# Patient Record
Sex: Female | Born: 1965 | Race: White | Hispanic: No | Smoking: Never smoker
Health system: Southern US, Community
[De-identification: ages and names within clinical notes are randomized; demographics above are authoritative.]

## PROBLEM LIST (undated history)

## (undated) VITALS — BP 141/91 | HR 97 | Temp 97.2°F | Resp 20 | Ht 67.0 in | Wt 167.0 lb

## (undated) DIAGNOSIS — M26609 Unspecified temporomandibular joint disorder, unspecified side: Secondary | ICD-10-CM

## (undated) DIAGNOSIS — F99 Mental disorder, not otherwise specified: Secondary | ICD-10-CM

## (undated) DIAGNOSIS — B009 Herpesviral infection, unspecified: Secondary | ICD-10-CM

## (undated) DIAGNOSIS — R51 Headache: Secondary | ICD-10-CM

## (undated) DIAGNOSIS — F419 Anxiety disorder, unspecified: Secondary | ICD-10-CM

## (undated) DIAGNOSIS — E049 Nontoxic goiter, unspecified: Secondary | ICD-10-CM

## (undated) DIAGNOSIS — E785 Hyperlipidemia, unspecified: Secondary | ICD-10-CM

## (undated) DIAGNOSIS — F329 Major depressive disorder, single episode, unspecified: Secondary | ICD-10-CM

## (undated) DIAGNOSIS — E039 Hypothyroidism, unspecified: Secondary | ICD-10-CM

## (undated) DIAGNOSIS — F32A Depression, unspecified: Secondary | ICD-10-CM

## (undated) DIAGNOSIS — F4310 Post-traumatic stress disorder, unspecified: Secondary | ICD-10-CM

## (undated) HISTORY — PX: HERNIA REPAIR: SHX51

## (undated) HISTORY — PX: FRACTURE SURGERY: SHX138

## (undated) HISTORY — PX: LASIK: SHX215

## (undated) HISTORY — PX: TUBAL LIGATION: SHX77

---

## 2006-05-26 HISTORY — PX: ANKLE FRACTURE SURGERY: SHX122

## 2012-08-29 ENCOUNTER — Encounter (HOSPITAL_COMMUNITY): Payer: Self-pay | Admitting: *Deleted

## 2012-08-29 ENCOUNTER — Inpatient Hospital Stay (HOSPITAL_COMMUNITY)
Admission: AD | Admit: 2012-08-29 | Discharge: 2012-09-03 | DRG: 885 | Disposition: A | Discharge status: Home / Self Care | Payer: 59 | Attending: Psychiatry | Admitting: Psychiatry

## 2012-08-29 DIAGNOSIS — F339 Major depressive disorder, recurrent, unspecified: Secondary | ICD-10-CM

## 2012-08-29 DIAGNOSIS — F332 Major depressive disorder, recurrent severe without psychotic features: Principal | ICD-10-CM | POA: Diagnosis present

## 2012-08-29 DIAGNOSIS — Z79899 Other long term (current) drug therapy: Secondary | ICD-10-CM

## 2012-08-29 DIAGNOSIS — B009 Herpesviral infection, unspecified: Secondary | ICD-10-CM

## 2012-08-29 DIAGNOSIS — F331 Major depressive disorder, recurrent, moderate: Secondary | ICD-10-CM | POA: Diagnosis present

## 2012-08-29 DIAGNOSIS — F411 Generalized anxiety disorder: Secondary | ICD-10-CM | POA: Diagnosis present

## 2012-08-29 DIAGNOSIS — R45851 Suicidal ideations: Secondary | ICD-10-CM

## 2012-08-29 DIAGNOSIS — F4310 Post-traumatic stress disorder, unspecified: Secondary | ICD-10-CM

## 2012-08-29 DIAGNOSIS — M26609 Unspecified temporomandibular joint disorder, unspecified side: Secondary | ICD-10-CM

## 2012-08-29 DIAGNOSIS — R51 Headache: Secondary | ICD-10-CM

## 2012-08-29 HISTORY — DX: Headache: R51

## 2012-08-29 HISTORY — DX: Herpesviral infection, unspecified: B00.9

## 2012-08-29 HISTORY — DX: Unspecified temporomandibular joint disorder, unspecified side: M26.609

## 2012-08-29 HISTORY — DX: Mental disorder, not otherwise specified: F99

## 2012-08-29 HISTORY — DX: Anxiety disorder, unspecified: F41.9

## 2012-08-29 HISTORY — DX: Major depressive disorder, single episode, unspecified: F32.9

## 2012-08-29 HISTORY — DX: Depression, unspecified: F32.A

## 2012-08-29 LAB — CBC
HCT: 37.7 % (ref 36.0–46.0)
Hemoglobin: 12.4 g/dL (ref 12.0–15.0)
MCHC: 32.9 g/dL (ref 30.0–36.0)
RBC: 4.29 MIL/uL (ref 3.87–5.11)

## 2012-08-29 LAB — COMPREHENSIVE METABOLIC PANEL
ALT: 26 U/L (ref 0–35)
Alkaline Phosphatase: 85 U/L (ref 39–117)
BUN: 5 mg/dL — ABNORMAL LOW (ref 6–23)
CO2: 25 mEq/L (ref 19–32)
GFR calc Af Amer: >90 mL/min (ref >90)
GFR calc non Af Amer: >90 mL/min (ref >90)
Glucose, Bld: 141 mg/dL — ABNORMAL HIGH (ref 70–99)
Potassium: 3.8 mEq/L (ref 3.5–5.1)
Sodium: 135 mEq/L (ref 135–145)
Total Bilirubin: 0.2 mg/dL — ABNORMAL LOW (ref 0.3–1.2)

## 2012-08-29 MED ORDER — ALUM & MAG HYDROXIDE-SIMETH 200-200-20 MG/5ML PO SUSP: 30.0000 mL | ORAL | Status: DC | PRN

## 2012-08-29 MED ORDER — ACETAMINOPHEN 325 MG PO TABS
650.0000 mg | ORAL_TABLET | Freq: Four times a day (QID) | ORAL | Status: DC | PRN
  Administered 2012-08-29 – 2012-09-03 (×10): 650 mg via ORAL

## 2012-08-29 MED ORDER — TRAZODONE HCL 50 MG PO TABS
50.0000 mg | ORAL_TABLET | Freq: Every evening | ORAL | Status: DC | PRN
  Administered 2012-08-29 – 2012-09-02 (×6): 50 mg via ORAL
  Filled 2012-08-29 (×6): qty 1

## 2012-08-29 MED ORDER — CHLORDIAZEPOXIDE HCL 25 MG PO CAPS
25.0000 mg | ORAL_CAPSULE | Freq: Four times a day (QID) | ORAL | Status: DC | PRN
  Administered 2012-08-29 – 2012-09-03 (×4): 25 mg via ORAL
  Filled 2012-08-29 (×4): qty 1

## 2012-08-29 MED ORDER — MAGNESIUM HYDROXIDE 400 MG/5ML PO SUSP: 30.0000 mL | Freq: Every day | ORAL | Status: DC | PRN
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
## 2012-08-29 NOTE — [facility] Group Notes (Signed)
[facility] LCSW Group Therapy  08/29/2012   Did not attend  Grossman-Orr, Analeya Luallen Jo 08/29/2012, 5:08 PM  
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
## 2012-08-29 NOTE — Tx Team (Signed)
Initial Interdisciplinary Treatment Plan  PATIENT STRENGTHS: (choose at least two) Ability for insight Active sense of humor Average or above average intelligence Capable of independent living Communication skills General fund of knowledge Motivation for treatment/growth  PATIENT STRESSORS: Health problems Marital or family conflict Medication change or noncompliance Occupational concerns Substance abuse Traumatic event   PROBLEM LIST: Problem List/Patient Goals Date to be addressed Date deferred Reason deferred Estimated date of resolution                                                         DISCHARGE CRITERIA:  Ability to meet basic life and health needs Adequate post-discharge living arrangements Improved stabilization in mood, thinking, and/or behavior Medical problems require only outpatient monitoring Motivation to continue treatment in a less acute level of care  PRELIMINARY DISCHARGE PLAN: Attend aftercare/continuing care group Attend 12-step recovery group Outpatient therapy Participate in family therapy  PATIENT/FAMIILY INVOLVEMENT: This treatment plan has been presented to and reviewed with the patient, Amanda Holder, and/or family member, .  The patient and family have been given the opportunity to ask questions and make suggestions.  Rich Brave 08/29/2012, 7:34 PM

## 2012-08-30 LAB — RAPID URINE DRUG SCREEN, HOSP PERFORMED
Amphetamines: NOT DETECTED
Barbiturates: NOT DETECTED
Opiates: NOT DETECTED
Tetrahydrocannabinol: NOT DETECTED

## 2012-08-30 LAB — URINALYSIS, ROUTINE W REFLEX MICROSCOPIC
Bilirubin Urine: NEGATIVE
Hgb urine dipstick: NEGATIVE
Ketones, ur: NEGATIVE mg/dL
Nitrite: NEGATIVE
Protein, ur: NEGATIVE mg/dL
Urobilinogen, UA: 0.2 mg/dL (ref 0.0–1.0)

## 2012-08-30 LAB — TSH: TSH: 2.057 u[IU]/mL (ref 0.350–4.500)

## 2012-08-30 MED ORDER — AMOXICILLIN 250 MG PO CAPS
250.0000 mg | ORAL_CAPSULE | Freq: Two times a day (BID) | ORAL | Status: DC
  Administered 2012-08-30 – 2012-09-03 (×9): 250 mg via ORAL
  Filled 2012-08-30 (×13): qty 1

## 2012-08-30 MED ORDER — VENLAFAXINE HCL 25 MG PO TABS
25.0000 mg | ORAL_TABLET | Freq: Every day | ORAL | Status: DC
  Administered 2012-08-30 – 2012-09-03 (×5): 25 mg via ORAL
  Filled 2012-08-30 (×7): qty 1

## 2012-08-30 MED ORDER — SALINE SPRAY 0.65 % NA SOLN
1.0000 | NASAL | Status: DC | PRN
  Administered 2012-08-30 – 2012-09-03 (×7): 1 via NASAL
  Filled 2012-08-30: qty 44

## 2012-08-30 NOTE — Progress Notes (Signed)
Nutrition Brief Note  Patient identified on the Malnutrition Screening Tool (MST) Report  Body mass index is 26.15 kg/(m^2). Patient meets criteria for overweight based on current BMI.   Current diet order is regular, patient is consuming approximately good% of meals at this time. Labs and medications reviewed.   Spoke with patient who reported being 180 lbs 2 years ago.  Now 167 lbs.  States that she had been eating well.  Overeats at times and has spoken to [facility] [facility] at times over the phone due to overeating and eating due to stress and anxiety/  Discussed briefly with patient .  No nutrition interventions warranted at this time. If nutrition issues arise, please consult RD.   Oran Rein, RD, LDN Clinical Inpatient Dietitian Pager:  [phone] Weekend and after hours pager:  [phone]

## 2012-08-30 NOTE — Tx Team (Signed)
Interdisciplinary Treatment Plan Update   Date Reviewed:  08/30/2012  Time Reviewed:  10:55 AM  Progress in Treatment:   Attending groups: Yes Participating in groups: Yes Taking medication as prescribed: Yes  Tolerating medication: Yes Family/Significant other contact made: No, but will ask for consent for collateral contact Patient understands diagnosis: Yes  Discussing patient identified problems/goals with staff: Yes Medical problems stabilized or resolved: Yes Denies suicidal/homicidal ideation: Yes Patient has not harmed self or others: Yes  For review of initial/current patient goals, please see plan of care.  Estimated Length of Stay:  3-5 days  Reasons for Continued Hospitalization:  Anxiety Depression Medication stabilization  New Problems/Goals identified:    Discharge Plan or Barriers:   Home with outpatient follow up MH-IOP  Additional Comments:  Patient reports admitting to the hospital with depression and PTSD and SI.  She rates depression at seven/eight and anxiety at six.  She rates hopelessness at eight and helplessness at nine.    Attendees:  Patient: Amanda Holder 08/30/2012 10:55 AM   Signature: Patrick North, MD 08/30/2012 10:55 AM  Signature:  Bethann Humble, RN 08/30/2012 10:55 AM  Signature:  08/30/2012 10:55 AM  Signature:Beverly Terrilee Croak, RN 08/30/2012 10:55 AM  Signature:  Neill Loft RN 08/30/2012 10:55 AM  Signature:  Juline Patch, LCSW 08/30/2012 10:55 AM  Signature: Silverio Decamp, PMH-NP 08/30/2012 10:55 AM  Signature:  08/30/2012 10:55 AM  Signature:  08/30/2012 10:55 AM  Signature:    Signature:    Signature:      Scribe for Treatment Team:   Juline Patch,  08/30/2012 10:55 AM

## 2012-08-30 NOTE — [facility] Group Notes (Signed)
[facility] LCSW Aftercare Discharge Planning Group Note   08/30/2012 11:04 AM  Participation Quality:  Appropriate  Mood/Affect:  Appropriate, Depressed and Flat  Depression Rating:  Seven/eight  Anxiety Rating:  six  Thoughts of Suicide:  No  Will you contract for safety?   N/A  Current AVH:  No  Plan for Discharge/Comments:  Patient reports not doing well today.  She rates depression at seven/eight and anxiety at six.  She has home and access to medications.  She is asking for a referral to MH-IOP at discharge.  Transportation Means:   Supports:  Enid Maultsby, Joesph July

## 2012-08-30 NOTE — Progress Notes (Signed)
Adult Psychoeducational Group Note  Date:  08/30/2012 Time:  6:28 PM  Group Topic/Focus:  Self Care:   The focus of this group is to help patients understand the importance of self-care in order to improve or restore emotional, physical, spiritual, interpersonal, and financial health.  Participation Level:  Active  Participation Quality:  Appropriate and Attentive  Affect:  Appropriate  Cognitive:  Appropriate  Insight: Appropriate  Engagement in Group:  Engaged  Modes of Intervention:  Activity, Discussion and Education  Additional Comments:  Amanda Holder attended group and did share during group. Patient defined self care in own terms. Patient reviewed and completed the self care assessment in the workbook. Patient was asked to review and share with staff and peers about weakness and strengths of physical, spiritual, emotional, psychological, relationship self care.   [name] 08/30/2012, 6:28 PM

## 2012-08-30 NOTE — Progress Notes (Signed)
Recreation Therapy Notes  Date: 04.07.2014 Time: 3:00pm  Location: [facility] Gym  Group Topic/Focus: Communication, Proofreader, Problem Solving  Participation Level:  Active  Participation Quality:  Appropriate  Affect:  Euthymic   Cognitive:  Appropriate   Additional Comments: Patient with peer completed "Trust Walk." Activity requires that patients lean on each other to walk to a pre-set point in the gym. Patient and peer successful. Patient with peers played "All Aboard." Patient with peers discussed strategy for getting all peers participating in group inside of a hula hoop. Patient with peers successful on second try. Patient stated this exercise will help her because she was able to see that letting down her guard can allow her to work with people.    Marykay Lex Taran Haynesworth, LRT/CTRS   Jearl Klinefelter 08/30/2012 3:58 PM

## 2012-08-30 NOTE — Progress Notes (Signed)
Patient ID: Amanda Holder, female   DOB: June 17, 1965, 47 y.o.   MRN: [id number] D- Met with treatment team and reports she has been depressed since 14.  Pt reports feeling very depressed and tearful, but says she doesn't want to cry iin front of people.  Affect is incongruent with content.  Patient is smiling and giggling while talking about her depression.  A- told patient about ordered meds. R- Patient says she thinks she has a sinus infection.  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 Says she hasn't haad a drink for over a year.  Has trouble sleeping.
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
## 2012-08-30 NOTE — [facility] Suicide Risk Assessment (Signed)
Suicide Risk Assessment  Admission Assessment     Nursing information obtained from:  Patient Demographic factors:  Divorced or widowed;Caucasian Current Mental Status:  Suicide plan;Self-harm thoughts Loss Factors:  Decrease in vocational status;Financial problems / change in socioeconomic status Historical Factors:  Prior suicide attempts;Family history of mental illness or substance abuse;Anniversary of important loss;Impulsivity Risk Reduction Factors:  Employed  CLINICAL FACTORS:   Depression:   Anhedonia Hopelessness Impulsivity Insomnia Severe  COGNITIVE FEATURES THAT CONTRIBUTE TO RISK:  Cognitively intact  SUICIDE RISK:   Moderate:  Frequent suicidal ideation with limited intensity, and duration, some specificity in terms of plans, no associated intent, good self-control, limited dysphoria/symptomatology, some risk factors present, and identifiable protective factors, including available and accessible social support.  PLAN OF CARE: Start medications as appropriate. Provide supportive counselling and education.  I certify that inpatient services furnished can reasonably be expected to improve the patient's condition.  Oluwademilade Mckiver 08/30/2012, 11:06 AM

## 2012-08-30 NOTE — Progress Notes (Signed)
Patient ID: Amanda Holder, female   DOB: Nov 22, 1965, 47 y.o.   MRN: [id number] D: Patient lying in bed with eyes closed. Respirations even and non-labored. A: Staff will monitor on q 15 minute checks, follow treatment plan, and give meds R: Appears to be sleeping.
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
## 2012-08-30 NOTE — [facility] Group Notes (Signed)
[facility] LCSW Group Therapy        Overcoming Obstacles 1:15 2:30 PM         08/30/2012 4:21 PM  Type of Therapy:  Group Therapy  Participation Level:  Minimal  Participation Quality:  Appropriate  Affect:  Appropriate  Cognitive:  Appropriate  Insight:  Developing/Improving  Engagement in Therapy:  Developing/Improving  Modes of Intervention:  Discussion, Exploration, Problem-solving, Rapport Building and Support  Summary of Progress/Problems:  Patient advised that her obstacle is getting involved in negative relationship and not knowing how to let go.    Wynn Banker 08/30/2012, 4:21 PM
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
## 2012-08-31 NOTE — Progress Notes (Signed)
Patient ID: Amanda Holder, female   DOB: 1965/10/12, 47 y.o.   MRN: [id number] D- Patient reports she slept well with meds.  Her appetite is good and her energy level is low.  Says she feels "rather numb"  This morning, attributing it to taking sleep med late last night.  Patient is having her period and finding this challenging to deal with here.  Would like to be able to use her menstrual cup..  Patient asks a lot of questions and apologizes for this.  Am encouraging her to continue to ask questions as a part of taking good care of herself and that there is not need to apologise.

## 2012-08-31 NOTE — [facility] Group Notes (Signed)
[facility] LCSW Aftercare Discharge Planning Group Note   08/31/2012 10:31 AM  Participation Quality:  Appropriate  Mood/Affect:   Appropriate  Depression Rating:  2  Anxiety Rating:  0  Thoughts of Suicide:  No  Will you contract for safety?   NA  Current AVH:  No  Plan for Discharge/Comments:    Patient reports feeling a little "spacey" today and believes the feelings are medication related.  She denies SI/HI.  Patient advised of wanting to attend MH-IOP at discharge.  Transportation Means: Patient reports having transportation.  Supports:  Patient advised of not having a good support system.  Fallen Crisostomo, Joesph July

## 2012-08-31 NOTE — [facility] Group Notes (Signed)
[facility] LCSW Group Therapy      Feelings About Diagnosis 1:15 - 2:30 PM          08/31/2012 3:12 PM  Type of Therapy:  Group Therapy  Participation Level:  Active  Participation Quality:  Appropriate  Affect:  Appropriate  Cognitive:  Appropriate  Insight:  Engaged  Engagement in Therapy:  Engaged  Modes of Intervention:  Discussion, Exploration, Problem-solving, Rapport Building and Support  Summary of Progress/Problems:  Patient she is uncomfortable with her diagnosis and tends to avoid questions and dealing with her problems.  She shared having a visual of recovering from surgery is helpful in aiding her to see things from a different perspective.  Wynn Banker 08/31/2012, 3:12 PM
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
## 2012-09-01 NOTE — [facility] Suicide Risk Assessment (Signed)
[facility] INPATIENT:  Family/Significant Other Suicide Prevention Education  Suicide Prevention Education:  Patient Refusal for Family/Significant Other Suicide Prevention Education: The patient Amanda Holder has refused to provide written consent for family/significant other to be provided Family/Significant Other Suicide Prevention Education during admission and/or prior to discharge.  Physician notified.  Patient advised of not wanting any family involved in her treatment.  Wynn Banker 09/01/2012, 12:42 PM

## 2012-09-01 NOTE — Progress Notes (Signed)
Patient ID: Amanda Holder, female   DOB: Jul 16, 1965, 47 y.o.   MRN: [id number]  Pt laying in bed resting with eyes closed. Respirations even and unlabored. No distress noted.
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
## 2012-09-01 NOTE — Progress Notes (Signed)
  D) Patient pleasant and cooperative upon my assessment. Patient completed Patient Self Inventory, reports slept "well," and  appetite is "good." Patient rates depression as   2/10, patient rates hopeless feelings as  2/10. Patient denies SI/HI, denies A/V hallucinations.   A) Patient offered support and encouragement, patient encouraged to discuss feelings/concerns with staff. Patient verbalized understanding. Patient monitored Q15 minutes for safety. Patient met with MD  to discuss today's goals and plan of care.  R) Patient visible in milieu, attending groups in day room and meals in dining room. Patient appropriate with staff and peers.   Patient taking medications as ordered. Patient insightful with a plan to "ask for help sooner when needed." Will continue to monitor.

## 2012-09-01 NOTE — Progress Notes (Signed)
Recreation Therapy Notes  Date: 04.09.2014 Time: 3:00pm Location: [address] Dayroom      Group Topic/Focus: Decision Making, Problem Solving, Communciation  Participation Level: Active  Participation Quality: Appropriate  Affect: Euthymic to Bright at times  Cognitive: Appropriate   Additional Comments: Activity: Lost at [employer]; Explanation: LRT reads aloud a scenario. Patients are grouped into groups of 3, patients have to prioritize items given through scenario to ensure patient survival.   Patient actively participated in group activity. Patient participated in wrap up discussion about using problem solving skills, decision making skills and communication skills to keep help combat effects of behavioral health concern. Patient smiled and laughed with group members.   Marykay Lex Kenneth Lax, LRT/CTRS  Rosilyn Coachman L 09/01/2012 4:53 PM

## 2012-09-01 NOTE — Progress Notes (Signed)
Adult Psychoeducational Group Note  Date:  09/01/2012 Time:  8:34 PM  Group Topic/Focus:  Wrap-Up Group:   The focus of this group is to help patients review their daily goal of treatment and discuss progress on daily workbooks.  Participation Level:  Active  Participation Quality:  Appropriate and Attentive  Affect:  Appropriate  Cognitive:  Alert and Appropriate  Insight: Appropriate  Engagement in Group:  Engaged  Modes of Intervention:  Discussion  Additional Comments:  Pt. Stated that she had a good day. Pt. Shared with group that she is learning different coping skills to use once released from [facility]. Pt. Is willing to follow up on medical treatment once released.   Bing Plume D 09/01/2012, 8:34 PM
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
## 2012-09-01 NOTE — Progress Notes (Signed)
Adult Psychoeducational Group Note  Date:  09/01/2012 Time:  11:47 AM  Group Topic/Focus:  Crisis Planning:   The purpose of this group is to help patients create a crisis plan for use upon discharge or in the future, as needed.   Participation Level:  Active  Participation Quality:  Drowsy  Affect:  Appropriate  Cognitive:  Alert and Appropriate  Insight: Good  Engagement in Group:  Limited  Modes of Intervention:  Discussion, Education and Support  Additional Comments: Pt said she is here to deal with her depression and anxiety. Pts goal is to get medication adjustment.  Jurgen Groeneveld T 09/01/2012, 11:47 AM

## 2012-09-02 DIAGNOSIS — F332 Major depressive disorder, recurrent severe without psychotic features: Principal | ICD-10-CM

## 2012-09-02 NOTE — Progress Notes (Signed)
Patient ID: Amanda Holder, female   DOB: [birth year], 47 y.o.   MRN: [id number]  D: Writer asked pt about her day. Pt informed the writer that the pharmacist explained the effects of the effexor. Stated she was told the effexor would initially be like a "jolt of caffeine". Pt stated she felt despair the first 2 nights, however, felt better today.  Pt believes that because of the medicine, "she was able to talk herself off her ledge".    A:  Support and encouragement was offered. 15 min checks continued for safety.  R: Pt remains safe.

## 2012-09-02 NOTE — Progress Notes (Signed)
Pt attended Karaoke group; pt was attentive and supportive of peers.

## 2012-09-02 NOTE — Progress Notes (Signed)
  D) Patient pleasant and cooperative upon my assessment. Patient completed Patient Self Inventory, reports slept "well," and  appetite is "good." Patient rates depression as   1/10, patient rates hopeless feelings as  1/10. Patient denies SI/HI, denies A/V hallucinations.   A) Patient offered support and encouragement, patient encouraged to discuss feelings/concerns with staff. Patient verbalized understanding. Patient monitored Q15 minutes for safety. Patient met with MD  to discuss today's goals and plan of care.  R) Patient visible in milieu, attending groups in day room and meals in dining room. Patient appropriate with staff and peers.   Patient taking medications as ordered. Patient insightful, moving forward "I will plan better so not always be rushing/late." Will continue to monitor.

## 2012-09-02 NOTE — Progress Notes (Signed)
Adult Psychoeducational Group Note  Date:  09/02/2012 Time: 1000  Group Topic/Focus:  Coping With Mental Health Crisis:   The purpose of this group is to help patients identify strategies for coping with mental health crisis.  Group discusses possible causes of crisis and ways to manage them effectively.  Participation Level:  Active  Participation Quality:  Appropriate, Attentive, Sharing and Supportive  Affect:  Appropriate  Cognitive:  Alert and Appropriate  Insight: Appropriate and Good  Engagement in Group:  Engaged and Supportive  Modes of Intervention:  Activity, Discussion and Education  Additional Comments:  Patient pleasant and engaged during group.   Amanda Holder 09/02/2012, 10:53 AM

## 2012-09-02 NOTE — [facility] Group Notes (Signed)
[facility] LCSW Aftercare Discharge Planning Group Note   09/02/2012 11:50 AM  Participation Quality:  Appropriate  Mood/Affect:  Appropriate  Depression Rating:  0  Anxiety Rating:  1/2  Thoughts of Suicide:  No  Will you contract for safety?   NA  Current AVH:  No  Plan for Discharge/Comments:   Patient reports doing really well today.  She shared she feels the best she has felt since being a teenager and is hopeful she will continue to do well on current medications.  She is hopeful to discharge home tomorrow and follow up with [facility] beginning on 09/07/12.  Transportation Means: Patient has transportation  Supports:  Limited support system.  Amanda Holder, Amanda Holder

## 2012-09-02 NOTE — [facility] Group Notes (Signed)
[facility] LCSW Group Therapy               [facility] 1:15 - 2:30   09/02/2012 12:40 PM  Type of Therapy:  Group Therapy  Participation Level:  Minimal  Participation Quality:  Appropriate and Attentive  Affect:  Appropriate  Cognitive:  Appropriate  Insight:  Engaged  Engagement in Therapy:  Engaged  Modes of Intervention:  Discussion, Education, Exploration, Problem-solving, Rapport Building and Support  Summary of Progress/Problems:  Patient listened attentively to speaker from [employer].  She thanked Educational psychologist for sharing he story and advised of plans to attend their programming.  Wynn Banker 09/02/2012, 12:40 PM
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
## 2012-09-03 DIAGNOSIS — F331 Major depressive disorder, recurrent, moderate: Secondary | ICD-10-CM

## 2012-09-03 MED ORDER — AMOXICILLIN 250 MG PO CAPS: 250.0000 mg | ORAL_CAPSULE | Freq: Two times a day (BID) | ORAL | Status: DC

## 2012-09-03 MED ORDER — MULTI-VITAMIN/MINERALS PO TABS: 1.0000 | ORAL_TABLET | Freq: Every day | ORAL | Status: DC

## 2012-09-03 MED ORDER — VENLAFAXINE HCL 25 MG PO TABS: 25.0000 mg | ORAL_TABLET | Freq: Every day | ORAL | Status: DC

## 2012-09-03 MED ORDER — TRAZODONE HCL 50 MG PO TABS: 50.0000 mg | ORAL_TABLET | Freq: Every evening | ORAL | Status: DC | PRN

## 2012-09-03 MED ORDER — SALINE SPRAY 0.65 % NA SOLN: 1.0000 | NASAL | Status: DC | PRN

## 2012-09-03 MED ORDER — OMEGA-3 FATTY ACIDS 1000 MG PO CAPS: 1.0000 g | ORAL_CAPSULE | Freq: Every day | ORAL | Status: AC

## 2012-09-03 MED ORDER — VITAMIN D 1000 UNITS PO TABS: 1000.0000 [IU] | ORAL_TABLET | Freq: Every day | ORAL | Status: AC

## 2012-09-03 MED ORDER — ACYCLOVIR 200 MG PO CAPS: 200.0000 mg | ORAL_CAPSULE | ORAL | Status: AC

## 2012-09-03 NOTE — Progress Notes (Signed)
Patient ID: Amanda Holder, female   DOB: Jun 27, 1965, 47 y.o.   MRN: [id number]  D: Patient pleasant on approach tonight. Reports possible discharge in am. Patient smiling and interacting well with other patients. Denies SI or a/v hallucinations. Cut off arm band due to being very tight that arm was swelling. A: Staff will monitor on q 15 minute checks, follow treatment plan, and give meds as ordered. R: Taking meds without issue tonight. No distress at present.
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
## 2012-09-03 NOTE — Progress Notes (Signed)
Adult Psychoeducational Group Note  Date:  09/03/2012 Time:  10:50 AM  Group Topic/Focus:  Making Healthy Choices:   The focus of this group is to help patients identify negative/unhealthy choices they were using prior to admission and identify positive/healthier coping strategies to replace them upon discharge.  Participation Level:  Active  Participation Quality:  Appropriate and Attentive  Affect:  Appropriate  Cognitive:  Alert and Appropriate  Insight: Good  Engagement in Group:  Engaged  Modes of Intervention:  Activity, Discussion and Support  Additional Comments:  Pt feels ready to discharge. Her mood has improved through the week and she has been active in group.  Careena Degraffenreid T 09/03/2012, 10:50 AM

## 2012-09-03 NOTE — [facility] Suicide Risk Assessment (Signed)
[facility] INPATIENT:  Family/Significant Other Suicide Prevention Education  Suicide Prevention Education:  Patient Refusal for Family/Significant Other Suicide Prevention Education: The patient Amanda Holder has refused to provide written consent for family/significant other to be provided Family/Significant Other Suicide Prevention Education during admission and/or prior to discharge.  Physician notified.  Patient shared it would only upset her contact to hear warning signs.  Wynn Banker 09/03/2012, 10:10 AM

## 2012-09-03 NOTE — Tx Team (Signed)
Interdisciplinary Treatment Plan Update   Date Reviewed:  09/03/2012  Time Reviewed:  10:05 AM  Progress in Treatment:   Attending groups: Yes Participating in groups: Yes Taking medication as prescribed: Yes  Tolerating medication: Yes Family/Significant other contact made: No,patient declined to allow family contact. Patient understands diagnosis: Yes  Discussing patient identified problems/goals with staff: Yes Medical problems stabilized or resolved: Yes Denies suicidal/homicidal ideation: Yes Patient has not harmed self or others: Yes  For review of initial/current patient goals, please see plan of care.  Estimated Length of Stay: Discharge today  Reasons for Continued Hospitalization:   New Problems/Goals identified:    Discharge Plan or Barriers:   Home with outpatient follow up [facility]-IOP  Additional Comments:  Patient reports doing well and denies SI/HI.  She is rating depression at zero and anxiety at four.  Attendees:  Patient: Amanda Holder 09/03/2012 10:05 AM   Signature: Fransisca Kaufmann, [facility] 09/03/2012 10:05 AM  Signature:  Nestor Ramp, RN 09/03/2012 10:05 AM  Signature:  09/03/2012 10:05 AM  Signature:  Harold Barban, RN 09/03/2012 10:05 AM  Signature:   09/03/2012 10:05 AM  Signature:  Juline Patch, LCSW 09/03/2012 10:05 AM  Signature: Silverio Decamp, PMH-NP 09/03/2012 10:05 AM  Signature:  09/03/2012 10:05 AM  Signature:  09/03/2012 10:05 AM  Signature:    Signature:    Signature:      Scribe for Treatment Team:   Juline Patch,  09/03/2012 10:05 AM

## 2012-09-03 NOTE — Progress Notes (Signed)
D) Pt being discharged to home accompanied by herself in her own car. Denies SI and HI. Rates her depression and hopelessness both at a 1. States that she is looking forward to going to outpatient and to continue to work on her issues. A) All medications explained to Pt. All belongings returned. Discharge plans explained to Pt. Medication teaching provided. Given support, reassurance and praise. R) Denies SI and HI. All belongings returned to Pt. She denies SI and HI.

## 2012-09-03 NOTE — Progress Notes (Signed)
[facility] Adult Case Management Discharge Plan :  Will you be returning to the same living situation after discharge: Yes,  Patient is returning to her home. At discharge, do you have transportation home?:Yes,  Patient has her car. Do you have the ability to pay for your medications:Yes,  Patient is able to afford medications.  Release of information consent forms completed and in the chart;  Patient's signature needed at discharge.  Patient to Follow up at: Follow-up Information   Follow up with [facility] On 09/07/2012. (You are scheduled to start MH-IOP on Tuesday, September 07, 2012 at 8:45 AM)    Contact information:   [address] [place], [place]  [address]  [phone]      Patient denies SI/HI:   Yes,  Patient is not endorsing SI/HI or thoughts of self harm.    Safety Planning and Suicide Prevention discussed:  Yes,  Reviewed during aftercare group.  Wynn Banker 09/03/2012, 10:07 AM
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
## 2012-09-07 ENCOUNTER — Encounter (HOSPITAL_COMMUNITY): Payer: Self-pay

## 2012-09-07 ENCOUNTER — Other Ambulatory Visit (HOSPITAL_COMMUNITY): Payer: 59 | Attending: Psychiatry | Admitting: Psychiatry

## 2012-09-07 DIAGNOSIS — F332 Major depressive disorder, recurrent severe without psychotic features: Secondary | ICD-10-CM | POA: Insufficient documentation

## 2012-09-07 DIAGNOSIS — R561 Post traumatic seizures: Secondary | ICD-10-CM

## 2012-09-07 DIAGNOSIS — F411 Generalized anxiety disorder: Secondary | ICD-10-CM | POA: Insufficient documentation

## 2012-09-07 DIAGNOSIS — F4310 Post-traumatic stress disorder, unspecified: Secondary | ICD-10-CM | POA: Insufficient documentation

## 2012-09-07 DIAGNOSIS — F331 Major depressive disorder, recurrent, moderate: Secondary | ICD-10-CM

## 2012-09-07 MED ORDER — VENLAFAXINE HCL 25 MG PO TABS: 50.0000 mg | ORAL_TABLET | Freq: Every day | ORAL | Status: DC

## 2012-09-07 MED ORDER — CHLORPROMAZINE HCL 25 MG PO TABS: 25.0000 mg | ORAL_TABLET | Freq: Every day | ORAL | Status: AC

## 2012-09-07 MED ORDER — VENLAFAXINE HCL 50 MG PO TABS: 50.0000 mg | ORAL_TABLET | Freq: Every day | ORAL | Status: DC
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
## 2012-09-08 ENCOUNTER — Other Ambulatory Visit (HOSPITAL_COMMUNITY): Payer: 59 | Attending: Psychiatry | Admitting: Psychiatry

## 2012-09-08 DIAGNOSIS — F411 Generalized anxiety disorder: Secondary | ICD-10-CM | POA: Insufficient documentation

## 2012-09-08 DIAGNOSIS — F331 Major depressive disorder, recurrent, moderate: Secondary | ICD-10-CM

## 2012-09-08 DIAGNOSIS — F332 Major depressive disorder, recurrent severe without psychotic features: Secondary | ICD-10-CM | POA: Insufficient documentation

## 2012-09-08 DIAGNOSIS — F4310 Post-traumatic stress disorder, unspecified: Secondary | ICD-10-CM | POA: Insufficient documentation

## 2012-09-08 NOTE — Progress Notes (Signed)
Patient ID: Amanda Holder, female   DOB: Aug 07, 1965, 47 y.o.   MRN: [id number] Patient reviewed and interviewed today, patient never started Thorazine states she wants to do it naturally and has been doing the sleep hygiene and self soothing techniques. Patient did return her Klonopin 0.5 mg quantity 193, cyclobenzaprine 10 mg tablets quantity 17 and Lamictal XR 100 mg quantity 98. Patient's blood pressure was checked and was found to be 170/92. Patient was referred to her primary care physician and will be seeing her this afternoon. Denies suicidal or homicidal ideation.

## 2012-09-08 NOTE — Progress Notes (Signed)
    Daily Group Progress Note  Program: IOP  Group Time: 9:00-10:30 am   Participation Level: Active  Behavioral Response: Appropriate  Type of Therapy:  Process Group  Summary of Progress: Patient was not present for this group due to participating in the initial orientation and assessment. This was patients first day in the group.      Group Time: 10:30 am - 12:00 pm   Participation Level:  Active  Behavioral Response: Appropriate  Type of Therapy: Psycho-education Group  Summary of Progress:  Patient participated in learning how to use the CBT skill of re framing to challenge negative thoughts and replace them with thoughts to reduce stress and depression.   Carman Ching, LCSW

## 2012-09-08 NOTE — Progress Notes (Signed)
    Daily Group Progress Note  Program: IOP  Group Time: 9:00-10:30 am   Participation Level: Active  Behavioral Response: Appropriate  Type of Therapy:  Process Group  Summary of Progress: Patient was introduced to defining depression as a medical condition and reducing stigma associated with it. Patient processed thoughts about depression and personal beliefs about the illness.      Group Time: 10:30 am - 12:00 pm   Participation Level:  Active  Behavioral Response: Appropriate  Type of Therapy: Psycho-education Group  Summary of Progress: Patient learned the causes and symptoms with depression to better understand what they are being treated for and how to identify signs of progress and relapse.   Carman Ching, LCSW

## 2012-09-08 NOTE — Progress Notes (Signed)
Patient Discharge Instructions:  Next Level Care Provider Has Access to the EMR, 09/08/12 Records provided to [facility] via [facility]/Epic access.  Jerelene Redden, 09/08/2012, 1:57 PM

## 2012-09-09 ENCOUNTER — Other Ambulatory Visit (HOSPITAL_COMMUNITY): Payer: 59 | Attending: Psychiatry | Admitting: Psychiatry

## 2012-09-09 DIAGNOSIS — F331 Major depressive disorder, recurrent, moderate: Secondary | ICD-10-CM

## 2012-09-09 DIAGNOSIS — F411 Generalized anxiety disorder: Secondary | ICD-10-CM | POA: Insufficient documentation

## 2012-09-09 DIAGNOSIS — F332 Major depressive disorder, recurrent severe without psychotic features: Secondary | ICD-10-CM | POA: Insufficient documentation

## 2012-09-09 DIAGNOSIS — F4310 Post-traumatic stress disorder, unspecified: Secondary | ICD-10-CM | POA: Insufficient documentation

## 2012-09-09 NOTE — Progress Notes (Signed)
    Daily Group Progress Note  Program: IOP  Group Time: 9:00-10:30 am   Participation Level: Active  Behavioral Response: Appropriate  Type of Therapy:  Process Group  Summary of Progress: Patient presented with anxious mood and affect. She reports struggling to learn ways to manage her anxiety. She was quiet and only talked when called upon to share, but when she talked there was a pressure to her speech and members identified how high her anxiety was.      Group Time: 10:30 am - 12:00 pm   Participation Level:  Active  Behavioral Response: Appropriate  Type of Therapy: Psycho-education Group  Summary of Progress:  Patient learned about anxiety and was educated on the symptoms associated with it. Patient learned how to identify the symptoms and discussed different treatment approaches.   Carman Ching, LCSW

## 2012-09-09 NOTE — Progress Notes (Signed)
Patient ID: Amanda Holder, female   DOB: 05-12-1966, 47 y.o.   MRN: Patient reviewed and interviewed today, states that she is ready to take the Thorazine 25 mg at bedtime. Wants to be switched to Effexor X. are discussed splitting the Effexor and taking 25 mg twice a day, patient stated understanding. Patient's listed her PCP yesterday and her blood pressure was down to 138/72. Patient denies suicidal or homicidal ideation.

## 2012-09-10 ENCOUNTER — Other Ambulatory Visit (HOSPITAL_COMMUNITY): Payer: Self-pay

## 2012-09-13 ENCOUNTER — Other Ambulatory Visit (HOSPITAL_COMMUNITY): Payer: 59 | Attending: Psychiatry | Admitting: Psychiatry

## 2012-09-13 DIAGNOSIS — F411 Generalized anxiety disorder: Secondary | ICD-10-CM | POA: Insufficient documentation

## 2012-09-13 DIAGNOSIS — F331 Major depressive disorder, recurrent, moderate: Secondary | ICD-10-CM

## 2012-09-13 DIAGNOSIS — F4310 Post-traumatic stress disorder, unspecified: Secondary | ICD-10-CM | POA: Insufficient documentation

## 2012-09-13 DIAGNOSIS — F332 Major depressive disorder, recurrent severe without psychotic features: Secondary | ICD-10-CM | POA: Insufficient documentation

## 2012-09-13 NOTE — Progress Notes (Signed)
    Daily Group Progress Note  Program: IOP  Group Time: 9:00-10:30 am   Participation Level: Active  Behavioral Response: Appropriate  Type of Therapy:  Process Group  Summary of Progress: Patient assessed their anxiety level and rated it. Patient then learned how to use the skill of biofeedback to reduce their stress level. Patient responded well to the technique and reduced their anxiety after using the tool. Patient reported feeling calmer afterward.      Group Time: 10:30 am - 12:00 pm    Participation Level:  Active  Behavioral Response: Appropriate  Type of Therapy: Psycho-education Group  Summary of Progress: Patient participated in a grief and loss group and identified current losses impacting wellness and effective ways to grieve.   Alekhya Gravlin E, LCSW 

## 2012-09-14 ENCOUNTER — Other Ambulatory Visit (HOSPITAL_COMMUNITY): Payer: 59 | Attending: Psychiatry | Admitting: Psychiatry

## 2012-09-14 DIAGNOSIS — F332 Major depressive disorder, recurrent severe without psychotic features: Secondary | ICD-10-CM | POA: Insufficient documentation

## 2012-09-14 DIAGNOSIS — F411 Generalized anxiety disorder: Secondary | ICD-10-CM | POA: Insufficient documentation

## 2012-09-14 DIAGNOSIS — F4310 Post-traumatic stress disorder, unspecified: Secondary | ICD-10-CM | POA: Insufficient documentation

## 2012-09-14 DIAGNOSIS — F331 Major depressive disorder, recurrent, moderate: Secondary | ICD-10-CM

## 2012-09-14 NOTE — Progress Notes (Signed)
Patient ID: Amanda Holder, female   DOB: January 26, 1966, 47 y.o.   MRN: [id number] Pt seen  states she is anxious , continues to have initial insomnia despite starting Thorazine 25 mg. Patient would like to switch the Effexor to Effexor XR 37 point 5 in the morning and wants to try sleep hygiene is to the Thorazine to help her sleep. Will DC regular Effexor and start her on Effexor XR 37.5 every morning and continue Thorazine at night. Patient denies suicidal or homicidal ideation and is not psychotic.

## 2012-09-14 NOTE — Progress Notes (Signed)
    Daily Group Progress Note  Program: IOP  Group Time: 9:00-10:30 am   Participation Level: Active  Behavioral Response: Appropriate  Type of Therapy:  Process Group  Summary of Progress: Patient continues to present with high anxiety symptoms and a very low sense of self worth. Patient is compassionate and empathic to the other group members and negative and belittling towards self. Patient presents with labile affect, laughing when talking about painful things and crying when challenged to explore behaviors she engages in that sabotage her wellness. Patient appears to view self in a "sick role" and talks about herself as if she is "damaged". Patient is working on Economist to improve self-esteem and decrease anxiety.      Group Time: 10:30 am - 12:00 pm   Participation Level:  Active  Behavioral Response: Appropriate  Type of Therapy: Psycho-education Group  Summary of Progress: Patient learned about anxiety and how to use Progressive Muscle Relaxation to manage anxiety symptoms.   Carman Ching, LCSW

## 2012-09-15 ENCOUNTER — Other Ambulatory Visit (HOSPITAL_COMMUNITY): Payer: 59 | Attending: Psychiatry | Admitting: Psychiatry

## 2012-09-15 DIAGNOSIS — F411 Generalized anxiety disorder: Secondary | ICD-10-CM | POA: Insufficient documentation

## 2012-09-15 DIAGNOSIS — F332 Major depressive disorder, recurrent severe without psychotic features: Secondary | ICD-10-CM | POA: Insufficient documentation

## 2012-09-15 DIAGNOSIS — F4310 Post-traumatic stress disorder, unspecified: Secondary | ICD-10-CM | POA: Insufficient documentation

## 2012-09-15 DIAGNOSIS — F331 Major depressive disorder, recurrent, moderate: Secondary | ICD-10-CM

## 2012-09-15 NOTE — Progress Notes (Signed)
    Daily Group Progress Note  Program: IOP  Group Time: 9:00-10:30 am   Participation Level: Active  Behavioral Response: Appropriate  Type of Therapy:  Process Group  Summary of Progress: Patient presented with anxious affect and accelerated speech and tangential thinking. She required redirection to stay on topic and to manage her anxiety symptoms. Patient talked about several anxiety treatment methods she has been trained in over the years and described being "very educated" on them, but states she struggles to use them on a regularly basis. She states she is going to try to focus them down to a couple of skills she can master and use consistently to manage anxiety symptoms. She also stated she is trying to accept the fact that she has anxiety and depression and there is not one easy fix to stop them.       Group Time: 10:30 am - 12:00 pm   Participation Level:  Active  Behavioral Response: Appropriate  Type of Therapy: Psycho-education Group  Summary of Progress: Patient participated in a group focused on educating patients on mental health support they can access through the [facility] during and after treatment to ensure continued wellness.   Carman Ching, LCSW

## 2012-09-16 ENCOUNTER — Other Ambulatory Visit (HOSPITAL_COMMUNITY): Payer: 59 | Attending: Psychiatry | Admitting: Psychiatry

## 2012-09-16 DIAGNOSIS — F4310 Post-traumatic stress disorder, unspecified: Secondary | ICD-10-CM | POA: Insufficient documentation

## 2012-09-16 DIAGNOSIS — F332 Major depressive disorder, recurrent severe without psychotic features: Secondary | ICD-10-CM | POA: Insufficient documentation

## 2012-09-16 DIAGNOSIS — F331 Major depressive disorder, recurrent, moderate: Secondary | ICD-10-CM

## 2012-09-16 DIAGNOSIS — F411 Generalized anxiety disorder: Secondary | ICD-10-CM | POA: Insufficient documentation

## 2012-09-16 NOTE — Progress Notes (Signed)
    Daily Group Progress Note  Program: IOP  Group Time: 9:00-10:30 am   Participation Level: Active  Behavioral Response: Appropriate  Type of Therapy:  Process Group  Summary of Progress: Patient was quiet until called upon. Patient was asked about this tendency to hold back in the group and patient indicated that she felt more comfortable and that she benefited more from listening. She appeared uncomfortable talking and tried to quickly shift to another member. Patient continues to struggle with very low self-esteem and low self-worth. Group members gave patient honest feedback about how patients tendencies to minimize herself within the group "push them away from her". Members expressed feelings such as "annyoance and frustration". Patient was open to this feedback and appears to be trying to use the feedback and skills to improve. She continues to report depression and anxiety symptoms.      Group Time: 10:30 am - 12:00 pm   Participation Level:  Active  Behavioral Response: Appropriate  Type of Therapy: Psycho-education Group  Summary of Progress: Patient learned the concept of "overcaring" and how it contributes to feelings of stress and anxiety. Patient learned skills to identify and reduce it.   Carman Ching, LCSW

## 2012-09-17 ENCOUNTER — Other Ambulatory Visit (HOSPITAL_COMMUNITY): Payer: 59 | Attending: Psychiatry | Admitting: Psychiatry

## 2012-09-17 DIAGNOSIS — F331 Major depressive disorder, recurrent, moderate: Secondary | ICD-10-CM

## 2012-09-17 DIAGNOSIS — F4310 Post-traumatic stress disorder, unspecified: Secondary | ICD-10-CM | POA: Insufficient documentation

## 2012-09-17 DIAGNOSIS — F332 Major depressive disorder, recurrent severe without psychotic features: Secondary | ICD-10-CM | POA: Insufficient documentation

## 2012-09-17 DIAGNOSIS — F411 Generalized anxiety disorder: Secondary | ICD-10-CM | POA: Insufficient documentation

## 2012-09-17 NOTE — Progress Notes (Signed)
    Daily Group Progress Note  Program: IOP  Group Time: 9:00-10:30 am   Participation Level: Active  Behavioral Response: Appropriate  Type of Therapy:  Process Group  Summary of Progress: Patient did not participate until called upon and did not interact with other group members unless called upon to share. Patient talks in a way that presents her with low self-worth and she has awareness of this. Patient decided to stop taking her sleep medication last night, against the doctors recommendation. Patient told all the group members, case manager and continued calling attention to stopping her medications. Patient also thoroughly documented it on her daily check in sheet and then talked about how she was worried that the doctor would discharge her for medication noncompliance but how she "could not lie" about it since the form asked the direct question. Members expressed to her how her behavior as nonaffective in getting her needs met if her goal was to stay in the program and how she could have handled the situation very differently to get a less negative outcome. Patient lacked insight into this even though every group member agreed and gave her feedback on it.      Group Time: 10:30 am - 12:00 pm   Participation Level:  Active  Behavioral Response: Appropriate  Type of Therapy: Psycho-education Group  Summary of Progress:  Patient learned the skill of Mindfulness and how to use it manage anxiety and stress and be more arcuately in the moment.   Carman Ching, LCSW

## 2012-09-20 ENCOUNTER — Other Ambulatory Visit (HOSPITAL_COMMUNITY): Payer: 59 | Attending: Psychiatry | Admitting: Psychiatry

## 2012-09-20 DIAGNOSIS — F332 Major depressive disorder, recurrent severe without psychotic features: Secondary | ICD-10-CM

## 2012-09-20 DIAGNOSIS — F1910 Other psychoactive substance abuse, uncomplicated: Secondary | ICD-10-CM

## 2012-09-20 DIAGNOSIS — F411 Generalized anxiety disorder: Secondary | ICD-10-CM

## 2012-09-20 DIAGNOSIS — F331 Major depressive disorder, recurrent, moderate: Secondary | ICD-10-CM

## 2012-09-20 DIAGNOSIS — F4310 Post-traumatic stress disorder, unspecified: Secondary | ICD-10-CM | POA: Insufficient documentation

## 2012-09-20 NOTE — Progress Notes (Signed)
    Daily Group Progress Note  Program: IOP  Group Time: 9:00-10:30 am   Participation Level: Active  Behavioral Response: Appropriate  Type of Therapy:  Process Group  Summary of Progress: Patient engaged in "bizzare behavior", avoided eye contacted, laughed at times that appeared awkward. Patient stated she has "30 plus years of coping skills and she just needs to apply them". She continues to present with low self esteem and self-worth and this is currently the main focus of treatment.      Group Time: 10:30 am - 12:00 pm   Participation Level:  Active  Behavioral Response: Appropriate  Type of Therapy: Psycho-education Group  Summary of Progress: Patient participated in a grief and loss support group and identified losses impacting wellness as well as effective coping strategies.   Carman Ching, LCSW

## 2012-09-21 ENCOUNTER — Other Ambulatory Visit (HOSPITAL_COMMUNITY): Payer: 59 | Attending: Psychiatry | Admitting: Psychiatry

## 2012-09-21 DIAGNOSIS — F411 Generalized anxiety disorder: Secondary | ICD-10-CM | POA: Insufficient documentation

## 2012-09-21 DIAGNOSIS — F331 Major depressive disorder, recurrent, moderate: Secondary | ICD-10-CM

## 2012-09-21 DIAGNOSIS — F4310 Post-traumatic stress disorder, unspecified: Secondary | ICD-10-CM | POA: Insufficient documentation

## 2012-09-21 DIAGNOSIS — F332 Major depressive disorder, recurrent severe without psychotic features: Secondary | ICD-10-CM | POA: Insufficient documentation

## 2012-09-21 MED ORDER — VENLAFAXINE HCL 50 MG PO TABS: 50.0000 mg | ORAL_TABLET | Freq: Every day | ORAL | Status: DC

## 2012-09-21 NOTE — Addendum Note (Signed)
Addended by: Margit Banda D on: 09/21/2012 04:26 PM   Modules accepted: Orders
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
## 2012-09-21 NOTE — Patient Instructions (Addendum)
Patient completed MH-IOP today.  Will follow up with Dr. Gibson Ramp on 09-29-12 @ 2pm and Maida Sale, LCSW on 09-30-12 @ 5:30pm.  Encouraged support groups.  Will return to work on 09-30-12, without any restrictions.
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
## 2012-09-21 NOTE — Progress Notes (Signed)
    Daily Group Progress Note  Program: IOP  Group Time: 9:00-10:30 am   Participation Level: Active  Behavioral Response: Appropriate  Type of Therapy:  Process Group  Summary of Progress: Patient present with very different mood and behavior today which group members appeared to notice and comment on. Patient was not the withdrawn, quiet member she normally is. She expressed anger towards the writer and the doctor and described having "negative experiences" within the group that she has been angery about since the start, but this was the first time she was addressing them. Patient was praised for her assertiveness and engaging in conflict, but was challenged on why she sat with these negative emotions without doing anything about them until her last day because she knew she would not see the staff anymore. Patient was reminded of the DBT skill of "if you can fix it fix it" and patient was encoaurged to explore ways she could have handled situations she experienced as negative in a more effective manner. Patient appeared upset that she was challenged to work on this instead of being allowed to "just be angry".      Group Time: 10:30 am - 12:00 pm   Participation Level:  Active  Behavioral Response: Appropriate  Type of Therapy: Psycho-education Group  Summary of Progress: Patient learned about the skill of using aromatherapy to manage depression, anxiety and sleep and was taught how to make room sprays.   Carman Ching, LCSW

## 2012-09-22 ENCOUNTER — Other Ambulatory Visit (HOSPITAL_COMMUNITY): Payer: Self-pay

## 2012-09-23 ENCOUNTER — Other Ambulatory Visit (HOSPITAL_COMMUNITY): Payer: Self-pay

## 2012-09-24 ENCOUNTER — Other Ambulatory Visit (HOSPITAL_COMMUNITY): Payer: Self-pay

## 2012-09-27 ENCOUNTER — Other Ambulatory Visit (HOSPITAL_COMMUNITY): Payer: Self-pay

## 2012-09-28 ENCOUNTER — Other Ambulatory Visit (HOSPITAL_COMMUNITY): Payer: Self-pay

## 2012-09-29 ENCOUNTER — Other Ambulatory Visit (HOSPITAL_COMMUNITY): Payer: Self-pay

## 2012-09-30 ENCOUNTER — Other Ambulatory Visit (HOSPITAL_COMMUNITY): Payer: Self-pay

## 2012-10-01 ENCOUNTER — Other Ambulatory Visit (HOSPITAL_COMMUNITY): Payer: Self-pay

## 2012-10-04 ENCOUNTER — Other Ambulatory Visit (HOSPITAL_COMMUNITY): Payer: Self-pay

## 2012-10-05 ENCOUNTER — Other Ambulatory Visit (HOSPITAL_COMMUNITY): Payer: Self-pay

## 2012-10-06 ENCOUNTER — Other Ambulatory Visit (HOSPITAL_COMMUNITY): Payer: Self-pay

## 2012-10-07 ENCOUNTER — Other Ambulatory Visit (HOSPITAL_COMMUNITY): Payer: Self-pay

## 2012-10-08 ENCOUNTER — Other Ambulatory Visit (HOSPITAL_COMMUNITY): Payer: Self-pay

## 2012-10-11 ENCOUNTER — Other Ambulatory Visit (HOSPITAL_COMMUNITY): Payer: Self-pay

## 2012-10-12 ENCOUNTER — Other Ambulatory Visit (HOSPITAL_COMMUNITY): Payer: Self-pay

## 2016-09-05 ENCOUNTER — Ambulatory Visit (INDEPENDENT_AMBULATORY_CARE_PROVIDER_SITE_OTHER): Payer: Worker's Compensation

## 2016-09-05 ENCOUNTER — Ambulatory Visit
Admission: EM | Admit: 2016-09-05 | Discharge: 2016-09-05 | Disposition: A | Discharge status: Home / Self Care | Payer: Worker's Compensation

## 2016-09-05 ENCOUNTER — Telehealth: Payer: Self-pay | Admitting: Physician Assistant

## 2016-09-05 DIAGNOSIS — S93401A Sprain of unspecified ligament of right ankle, initial encounter: Secondary | ICD-10-CM | POA: Diagnosis not present

## 2016-09-05 DIAGNOSIS — M79642 Pain in left hand: Secondary | ICD-10-CM | POA: Diagnosis not present

## 2016-09-05 DIAGNOSIS — S63502A Unspecified sprain of left wrist, initial encounter: Secondary | ICD-10-CM | POA: Diagnosis not present

## 2016-09-05 DIAGNOSIS — S4992XA Unspecified injury of left shoulder and upper arm, initial encounter: Secondary | ICD-10-CM
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
## 2016-09-05 NOTE — Telephone Encounter (Signed)
Contacted patient via phone at [phone]. No answer. Pt left message regarding no fractures identified on today's xrays per final radiology read.  Sherrlyn Hock PA-C

## 2016-09-05 NOTE — Discharge Instructions (Signed)
-  would use Ace type wrap for support of R ankle along with supportive lace up shoes -can use an ace wrap to left wrist if needed for extra support or needing to perform tasks using the wrist -rest, elevate, and ice ankle and L hand. Would try to stay off the ankle for next couple of days -return to the clinic should symptoms not improve -ibuprofen or Tylenol for pain
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
## 2017-05-14 ENCOUNTER — Other Ambulatory Visit: Payer: Self-pay | Admitting: *Deleted

## 2017-05-14 ENCOUNTER — Inpatient Hospital Stay
Admission: RE | Admit: 2017-05-14 | Discharge: 2017-05-14 | Disposition: A | Discharge status: Home / Self Care | Payer: Self-pay | Source: Ambulatory Visit | Attending: *Deleted | Admitting: *Deleted

## 2017-05-14 DIAGNOSIS — Z9289 Personal history of other medical treatment: Secondary | ICD-10-CM

## 2017-06-29 ENCOUNTER — Ambulatory Visit: Payer: Self-pay | Attending: Oncology

## 2017-06-29 ENCOUNTER — Ambulatory Visit
Admission: RE | Admit: 2017-06-29 | Discharge: 2017-06-29 | Disposition: A | Discharge status: Home / Self Care | Payer: Self-pay | Source: Ambulatory Visit | Attending: Oncology | Admitting: Oncology

## 2017-06-29 VITALS — BP 133/84 | HR 70 | Temp 98.0°F | Ht 67.0 in | Wt 187.0 lb

## 2017-06-29 DIAGNOSIS — Z0000 Encounter for general adult medical examination without abnormal findings: Secondary | ICD-10-CM

## 2017-06-29 NOTE — Progress Notes (Signed)
Subjective:     Patient ID: Silas SacramentoValery Poupard, female   DOB: 02/05/1966, 52 y.o.   MRN: [id number]  HPI   Review of Systems     Objective:   Physical Exam  Pulmonary/Chest: Right breast exhibits no inverted nipple, no mass, no nipple discharge, no skin change and no tenderness. Left breast exhibits no inverted nipple, no mass, no nipple discharge, no skin change and no tenderness. Breasts are symmetrical.       Assessment:     52 year [facility] Patient screened, and meets [facility] eligibility.  Patient does not have insurance, Medicare or Medicaid.  Handout given on Affordable Care Act. Instructed patient on breast self awareness using teach back method.  CBE unremarkable.  No mass or lump palpated.  Patient anxious, and states she talks a lot when nervous.      Plan:     Sent for bilateral screening mammogram.

## 2017-08-05 NOTE — Progress Notes (Signed)
Letter mailed from [facility] to notify of normal mammogram results.  Patient to return in one year for annual screening.  Copy to [facility]. 

## 2018-05-27 ENCOUNTER — Other Ambulatory Visit: Payer: Self-pay | Admitting: Family Medicine

## 2018-05-27 DIAGNOSIS — Z1231 Encounter for screening mammogram for malignant neoplasm of breast: Secondary | ICD-10-CM

## 2018-07-27 ENCOUNTER — Ambulatory Visit: Payer: Self-pay

## 2018-07-27 ENCOUNTER — Encounter (INDEPENDENT_AMBULATORY_CARE_PROVIDER_SITE_OTHER): Payer: Self-pay

## 2018-08-30 ENCOUNTER — Ambulatory Visit: Payer: Self-pay

## 2019-02-02 ENCOUNTER — Ambulatory Visit: Payer: Medicaid Other

## 2019-02-11 ENCOUNTER — Telehealth: Payer: Self-pay

## 2019-02-11 NOTE — Telephone Encounter (Signed)
Pre-visit screening call attempted prior to [facility] clinic visit on 02/15/2019. Left msg.

## 2019-02-15 ENCOUNTER — Ambulatory Visit: Payer: Medicaid Other

## 2019-03-11 ENCOUNTER — Telehealth: Payer: Self-pay

## 2019-03-11 NOTE — Telephone Encounter (Signed)
2nd attempt for pre-visit screening call prior to [facility] appointment on 03/15/2019.

## 2019-03-11 NOTE — Telephone Encounter (Signed)
Pre-visit screening call attempted prior to [facility] appointment on 03/15/2019. No answer / left a msg and reminded pt of appt. Date, time, and location.

## 2019-03-15 ENCOUNTER — Ambulatory Visit: Payer: Self-pay | Attending: Oncology

## 2019-03-15 ENCOUNTER — Ambulatory Visit
Admission: RE | Admit: 2019-03-15 | Discharge: 2019-03-15 | Disposition: A | Discharge status: Home / Self Care | Payer: Medicare Other | Source: Ambulatory Visit | Attending: Family Medicine | Admitting: Family Medicine

## 2019-03-15 ENCOUNTER — Other Ambulatory Visit: Payer: Self-pay

## 2019-03-15 VITALS — BP 129/88 | HR 68 | Temp 97.2°F | Ht 66.5 in | Wt 183.0 lb

## 2019-03-15 DIAGNOSIS — Z1231 Encounter for screening mammogram for malignant neoplasm of breast: Secondary | ICD-10-CM

## 2019-03-15 DIAGNOSIS — Z0000 Encounter for general adult medical examination without abnormal findings: Secondary | ICD-10-CM

## 2019-03-15 NOTE — Progress Notes (Signed)
  Subjective:     Patient ID: Amanda Holder, female   DOB: 26-Aug-1965, 53 y.o.   MRN: [id number]  HPI   Review of Systems     Objective:   Physical Exam Chest:     Breasts:        Right: No swelling, bleeding, inverted nipple, mass, nipple discharge, skin change or tenderness.        Left: No swelling, bleeding, inverted nipple, mass, nipple discharge, skin change or tenderness.        Assessment:     53 year old patient returns for annual [facility]CP screening.  Patient screened, and meets BCCCP eligibility.  Patient does not have insurance, Medicare or Medicaid. Instructed patient on breast self awareness using teach back method. Clinical breast exam unremarkable. 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 Just not comfortable with body she says. Discussed books, [employer] app to relax patient.  Risk Assessment    Risk Scores      03/15/2019   Last edited by: Rico Junker, RN   5-year risk: 1.3 %   Lifetime risk: 9.1 %            Plan:     Sent for bilateral screening mammogram.

## 2019-03-16 NOTE — Progress Notes (Signed)
Letter mailed from [facility] to notify of normal mammogram results.  Patient to return in one year for annual screening.  Copy to [facility]. 

## 2019-03-22 ENCOUNTER — Encounter: Payer: Self-pay | Admitting: *Deleted

## 2019-05-26 ENCOUNTER — Emergency Department
Admission: EM | Admit: 2019-05-26 | Discharge: 2019-05-27 | Disposition: A | Discharge status: Home / Self Care | Payer: Self-pay | Attending: Emergency Medicine | Admitting: Emergency Medicine

## 2019-05-26 ENCOUNTER — Emergency Department: Payer: Self-pay

## 2019-05-26 ENCOUNTER — Other Ambulatory Visit: Payer: Self-pay

## 2019-05-26 ENCOUNTER — Encounter: Payer: Self-pay | Admitting: Emergency Medicine

## 2019-05-26 DIAGNOSIS — W11XXXA Fall on and from ladder, initial encounter: Secondary | ICD-10-CM | POA: Insufficient documentation

## 2019-05-26 DIAGNOSIS — Y9389 Activity, other specified: Secondary | ICD-10-CM | POA: Insufficient documentation

## 2019-05-26 DIAGNOSIS — S92009A Unspecified fracture of unspecified calcaneus, initial encounter for closed fracture: Secondary | ICD-10-CM

## 2019-05-26 DIAGNOSIS — Y92019 Unspecified place in single-family (private) house as the place of occurrence of the external cause: Secondary | ICD-10-CM | POA: Insufficient documentation

## 2019-05-26 DIAGNOSIS — Y999 Unspecified external cause status: Secondary | ICD-10-CM | POA: Insufficient documentation

## 2019-05-26 DIAGNOSIS — S92021A Displaced fracture of anterior process of right calcaneus, initial encounter for closed fracture: Secondary | ICD-10-CM | POA: Insufficient documentation

## 2019-05-26 MED ORDER — LORAZEPAM 1 MG PO TABS
1.0000 mg | ORAL_TABLET | Freq: Once | ORAL | Status: AC
  Administered 2019-05-26: 1 mg via ORAL
  Filled 2019-05-26: qty 1

## 2019-05-26 MED ORDER — KETOROLAC TROMETHAMINE 10 MG PO TABS
10.0000 mg | ORAL_TABLET | Freq: Once | ORAL | Status: AC
  Administered 2019-05-26: 10 mg via ORAL
  Filled 2019-05-26: qty 1

## 2019-05-26 MED ORDER — GABAPENTIN 300 MG PO CAPS
300.0000 mg | ORAL_CAPSULE | ORAL | Status: AC
  Administered 2019-05-26: 23:00:00 300 mg via ORAL
  Filled 2019-05-26: qty 1

## 2019-05-26 NOTE — ED Triage Notes (Signed)
Patient arrives via EMS after falling off ladder hanging christmas lights, fell flat on foot and is complaining of R ankle pain. Has had surgery on R foot before

## 2019-05-26 NOTE — Discharge Instructions (Signed)
Your x-ray shows a fracture of the calcaneus bone in your right foot.  Keep the splint on at all times, and keep it dry.  Use crutches and do not put any weight on the right foot.  You should follow-up with podiatry early next week.  You can take all your medications as well as ibuprofen and Tylenol for pain control.
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
## 2019-05-27 NOTE — ED Notes (Signed)
Pt has exhausted all efforts to obtain a ride home. EMS called to give a ride home.

## 2019-05-27 NOTE — ED Notes (Signed)
Pt requested a walker instead of crutches. Pt instructed to call her friend to pick her up.

## 2019-05-27 NOTE — ED Notes (Signed)
Pt continues to attempt to call friends to find a ride. Cab company will not answer the phone.

## 2019-05-30 ENCOUNTER — Other Ambulatory Visit: Payer: Self-pay | Admitting: Podiatry

## 2019-05-30 DIAGNOSIS — S92011A Displaced fracture of body of right calcaneus, initial encounter for closed fracture: Secondary | ICD-10-CM

## 2019-05-31 ENCOUNTER — Ambulatory Visit
Admission: RE | Admit: 2019-05-31 | Discharge: 2019-05-31 | Disposition: A | Discharge status: Home / Self Care | Payer: Self-pay | Source: Ambulatory Visit | Attending: Podiatry | Admitting: Podiatry

## 2019-05-31 ENCOUNTER — Other Ambulatory Visit: Payer: Self-pay | Admitting: Podiatry

## 2019-05-31 ENCOUNTER — Other Ambulatory Visit: Payer: Self-pay

## 2019-05-31 DIAGNOSIS — S92011A Displaced fracture of body of right calcaneus, initial encounter for closed fracture: Secondary | ICD-10-CM | POA: Insufficient documentation

## 2019-06-01 ENCOUNTER — Encounter
Admission: RE | Admit: 2019-06-01 | Discharge: 2019-06-01 | Disposition: A | Discharge status: Home / Self Care | Payer: Self-pay | Source: Ambulatory Visit | Attending: Podiatry | Admitting: Podiatry

## 2019-06-01 HISTORY — DX: Post-traumatic stress disorder, unspecified: F43.10

## 2019-06-01 HISTORY — DX: Nontoxic goiter, unspecified: E04.9

## 2019-06-01 HISTORY — DX: Hypothyroidism, unspecified: E03.9

## 2019-06-01 HISTORY — DX: Hyperlipidemia, unspecified: E78.5

## 2019-06-01 NOTE — Pre-Procedure Instructions (Signed)
Secure chat msg to Dr Ether Griffins noting patients request for nausea med for use after surgery. Pain meds make her sick.

## 2019-06-01 NOTE — Patient Instructions (Signed)
Your procedure is scheduled on: 06/03/19 Report to [place]. To find out your arrival time please call [phone] between 1PM - 3PM on 06/02/19.  Remember: Instructions that are not followed completely may result in serious medical risk, up to and including death, or upon the discretion of your surgeon and anesthesiologist your surgery may need to be rescheduled.     _X__ 1. Do not eat food after midnight the night before your procedure.                 No gum chewing or hard candies. You may drink clear liquids up to 2 hours                 before you are scheduled to arrive for your surgery- DO not drink clear                 liquids within 2 hours of the start of your surgery.                 Clear Liquids include:  water, apple juice without pulp, clear carbohydrate                 drink such as Clearfast or Gatorade, Black Coffee or Tea (Do not add                 anything to coffee or tea). Diabetics water only  __X__2.  On the morning of surgery brush your teeth with toothpaste and water, you                 may rinse your mouth with mouthwash if you wish.  Do not swallow any              toothpaste of mouthwash.     _X__ 3.  No Alcohol for 24 hours before or after surgery.   _X__ 4.  Do Not Smoke or use e-cigarettes For 24 Hours Prior to Your Surgery.                 Do not use any chewable tobacco products for at least 6 hours prior to                 surgery.  ____  5.  Bring all medications with you on the day of surgery if instructed.   __X__  6.  Notify your doctor if there is any change in your medical condition      (cold, fever, infections).     Do not wear jewelry, make-up, hairpins, clips or nail polish. Do not wear lotions, powders, or perfumes.  Do not shave 48 hours prior to surgery. Men may shave face and neck. Do not bring valuables to the hospital.    [facility] is not responsible for any belongings or  valuables.  Contacts, dentures/partials or body piercings may not be worn into surgery. Bring a case for your contacts, glasses or hearing aids, a denture cup will be supplied. Leave your suitcase in the car. After surgery it may be brought to your room. For patients admitted to the hospital, discharge time is determined by your treatment team.   Patients discharged the day of surgery will not be allowed to drive home.   Please read over the following fact sheets that you were given:   MRSA Information  __X__ Take these medicines the morning of surgery with A SIP OF WATER:  1. gabapentin  2. levothyroxine  3. prazosin  4. trintellix  5.  6.  ____ Fleet Enema (as directed)   ____ Use CHG Soap/SAGE wipes as directed  ____ Use inhalers on the day of surgery  ____ Stop metformin/Janumet/Farxiga 2 days prior to surgery    ____ Take 1/2 of usual insulin dose the night before surgery. No insulin the morning          of surgery.   ____ Stop Blood Thinners Coumadin/Plavix/Xarelto/Pleta/Pradaxa/Eliquis/Effient/Aspirin  on   Or contact your Surgeon, Cardiologist or Medical Doctor regarding  ability to stop your blood thinners  __X__ Stop Anti-inflammatories 7 days before surgery such as Advil, Ibuprofen, Motrin,  BC or Goodies Powder, Naprosyn, Naproxen, Aleve, Aspirin    __X__ Stop all herbal supplements, fish oil or vitamin E until after surgery.    ____ Bring C-Pap to the hospital.     Stop fish oil, melatonin. Ginger, ibuprofen.

## 2019-06-02 ENCOUNTER — Other Ambulatory Visit: Payer: Self-pay

## 2019-06-02 ENCOUNTER — Encounter
Admission: RE | Admit: 2019-06-02 | Discharge: 2019-06-02 | Disposition: A | Discharge status: Home / Self Care | Payer: Self-pay | Source: Ambulatory Visit | Attending: Podiatry | Admitting: Podiatry

## 2019-06-02 DIAGNOSIS — Z01812 Encounter for preprocedural laboratory examination: Secondary | ICD-10-CM | POA: Insufficient documentation

## 2019-06-02 DIAGNOSIS — Z20822 Contact with and (suspected) exposure to covid-19: Secondary | ICD-10-CM | POA: Insufficient documentation

## 2019-06-03 ENCOUNTER — Ambulatory Visit: Payer: Self-pay | Admitting: Anesthesiology

## 2019-06-03 ENCOUNTER — Other Ambulatory Visit: Payer: Self-pay

## 2019-06-03 ENCOUNTER — Encounter
Admission: RE | Disposition: A | Discharge status: Home / Self Care | Payer: Self-pay | Source: Home / Self Care | Attending: Podiatry

## 2019-06-03 ENCOUNTER — Ambulatory Visit: Payer: Self-pay

## 2019-06-03 ENCOUNTER — Ambulatory Visit
Admission: RE | Admit: 2019-06-03 | Discharge: 2019-06-03 | Disposition: A | Discharge status: Home / Self Care | Payer: Self-pay | Attending: Podiatry | Admitting: Podiatry

## 2019-06-03 ENCOUNTER — Encounter: Payer: Self-pay | Admitting: Podiatry

## 2019-06-03 DIAGNOSIS — E039 Hypothyroidism, unspecified: Secondary | ICD-10-CM | POA: Insufficient documentation

## 2019-06-03 DIAGNOSIS — G43909 Migraine, unspecified, not intractable, without status migrainosus: Secondary | ICD-10-CM | POA: Insufficient documentation

## 2019-06-03 DIAGNOSIS — Z825 Family history of asthma and other chronic lower respiratory diseases: Secondary | ICD-10-CM | POA: Insufficient documentation

## 2019-06-03 DIAGNOSIS — E559 Vitamin D deficiency, unspecified: Secondary | ICD-10-CM | POA: Insufficient documentation

## 2019-06-03 DIAGNOSIS — S92061A Displaced intraarticular fracture of right calcaneus, initial encounter for closed fracture: Secondary | ICD-10-CM | POA: Insufficient documentation

## 2019-06-03 DIAGNOSIS — Z888 Allergy status to other drugs, medicaments and biological substances status: Secondary | ICD-10-CM | POA: Insufficient documentation

## 2019-06-03 DIAGNOSIS — E785 Hyperlipidemia, unspecified: Secondary | ICD-10-CM | POA: Insufficient documentation

## 2019-06-03 DIAGNOSIS — Z8041 Family history of malignant neoplasm of ovary: Secondary | ICD-10-CM | POA: Insufficient documentation

## 2019-06-03 DIAGNOSIS — F4310 Post-traumatic stress disorder, unspecified: Secondary | ICD-10-CM | POA: Insufficient documentation

## 2019-06-03 DIAGNOSIS — F329 Major depressive disorder, single episode, unspecified: Secondary | ICD-10-CM | POA: Insufficient documentation

## 2019-06-03 DIAGNOSIS — Z885 Allergy status to narcotic agent status: Secondary | ICD-10-CM | POA: Insufficient documentation

## 2019-06-03 DIAGNOSIS — W11XXXA Fall on and from ladder, initial encounter: Secondary | ICD-10-CM | POA: Insufficient documentation

## 2019-06-03 DIAGNOSIS — Z79899 Other long term (current) drug therapy: Secondary | ICD-10-CM | POA: Insufficient documentation

## 2019-06-03 DIAGNOSIS — Z811 Family history of alcohol abuse and dependence: Secondary | ICD-10-CM | POA: Insufficient documentation

## 2019-06-03 DIAGNOSIS — Z8249 Family history of ischemic heart disease and other diseases of the circulatory system: Secondary | ICD-10-CM | POA: Insufficient documentation

## 2019-06-03 DIAGNOSIS — Z419 Encounter for procedure for purposes other than remedying health state, unspecified: Secondary | ICD-10-CM

## 2019-06-03 DIAGNOSIS — E049 Nontoxic goiter, unspecified: Secondary | ICD-10-CM | POA: Insufficient documentation

## 2019-06-03 DIAGNOSIS — Z818 Family history of other mental and behavioral disorders: Secondary | ICD-10-CM | POA: Insufficient documentation

## 2019-06-03 DIAGNOSIS — Z823 Family history of stroke: Secondary | ICD-10-CM | POA: Insufficient documentation

## 2019-06-03 DIAGNOSIS — F419 Anxiety disorder, unspecified: Secondary | ICD-10-CM | POA: Insufficient documentation

## 2019-06-03 HISTORY — PX: CALCANEAL OSTEOTOMY: SHX1281

## 2019-06-03 LAB — SARS CORONAVIRUS 2 (TAT 6-24 HRS): SARS Coronavirus 2: NEGATIVE

## 2019-06-03 LAB — POCT PREGNANCY, URINE: Preg Test, Ur: NEGATIVE

## 2019-06-03 SURGERY — OSTEOTOMY, CALCANEUS
Anesthesia: General | Site: Foot | Laterality: Right

## 2019-06-03 MED ORDER — MIDAZOLAM HCL 2 MG/2ML IJ SOLN
INTRAMUSCULAR | Status: DC | PRN
  Administered 2019-06-03: 2 mg via INTRAVENOUS

## 2019-06-03 MED ORDER — LACTATED RINGERS IV SOLN: INTRAVENOUS | Status: DC | PRN

## 2019-06-03 MED ORDER — FENTANYL CITRATE (PF) 100 MCG/2ML IJ SOLN
INTRAMUSCULAR | Status: AC
  Filled 2019-06-03: qty 2

## 2019-06-03 MED ORDER — BUPIVACAINE LIPOSOME 1.3 % IJ SUSP
INTRAMUSCULAR | Status: AC
  Filled 2019-06-03: qty 20

## 2019-06-03 MED ORDER — ONDANSETRON HCL 4 MG/2ML IJ SOLN: 4.0000 mg | Freq: Once | INTRAMUSCULAR | Status: DC | PRN

## 2019-06-03 MED ORDER — POVIDONE-IODINE 7.5 % EX SOLN
Freq: Once | CUTANEOUS | Status: DC
  Filled 2019-06-03: qty 118

## 2019-06-03 MED ORDER — PROPOFOL 10 MG/ML IV BOLUS
INTRAVENOUS | Status: AC
  Filled 2019-06-03: qty 20

## 2019-06-03 MED ORDER — FENTANYL CITRATE (PF) 100 MCG/2ML IJ SOLN
25.0000 ug | INTRAMUSCULAR | Status: DC | PRN
  Administered 2019-06-03 (×2): 25 ug via INTRAVENOUS

## 2019-06-03 MED ORDER — PHENYLEPHRINE HCL (PRESSORS) 10 MG/ML IV SOLN
INTRAVENOUS | Status: AC
  Filled 2019-06-03: qty 1

## 2019-06-03 MED ORDER — MIDAZOLAM HCL 2 MG/2ML IJ SOLN
INTRAMUSCULAR | Status: AC
  Filled 2019-06-03: qty 2

## 2019-06-03 MED ORDER — SODIUM CHLORIDE 0.9 % IV SOLN
INTRAVENOUS | Status: DC | PRN
  Administered 2019-06-03: 30 ug/min via INTRAVENOUS

## 2019-06-03 MED ORDER — OXYCODONE-ACETAMINOPHEN 5-325 MG PO TABS: 1.0000 | ORAL_TABLET | Freq: Four times a day (QID) | ORAL | 0 refills | Status: AC | PRN

## 2019-06-03 MED ORDER — ROCURONIUM BROMIDE 100 MG/10ML IV SOLN
INTRAVENOUS | Status: DC | PRN
  Administered 2019-06-03: 10 mg via INTRAVENOUS
  Administered 2019-06-03: 40 mg via INTRAVENOUS

## 2019-06-03 MED ORDER — LIDOCAINE HCL URETHRAL/MUCOSAL 2 % EX GEL
CUTANEOUS | Status: AC
  Filled 2019-06-03: qty 5

## 2019-06-03 MED ORDER — BUPIVACAINE LIPOSOME 1.3 % IJ SUSP
INTRAMUSCULAR | Status: DC | PRN
  Administered 2019-06-03: 10 mL
  Administered 2019-06-03: 8 mL

## 2019-06-03 MED ORDER — EPHEDRINE SULFATE 50 MG/ML IJ SOLN
INTRAMUSCULAR | Status: DC | PRN
  Administered 2019-06-03: 15 mg via INTRAVENOUS

## 2019-06-03 MED ORDER — LIDOCAINE HCL (CARDIAC) PF 100 MG/5ML IV SOSY
PREFILLED_SYRINGE | INTRAVENOUS | Status: DC | PRN
  Administered 2019-06-03: 100 mg via INTRAVENOUS

## 2019-06-03 MED ORDER — FAMOTIDINE 20 MG PO TABS
ORAL_TABLET | ORAL | Status: AC
  Administered 2019-06-03: 10:00:00 20 mg via ORAL
  Filled 2019-06-03: qty 1

## 2019-06-03 MED ORDER — EPINEPHRINE PF 1 MG/ML IJ SOLN
INTRAMUSCULAR | Status: AC
  Filled 2019-06-03: qty 1

## 2019-06-03 MED ORDER — DEXAMETHASONE SODIUM PHOSPHATE 10 MG/ML IJ SOLN
INTRAMUSCULAR | Status: DC | PRN
  Administered 2019-06-03: 10 mg via INTRAVENOUS

## 2019-06-03 MED ORDER — DEXAMETHASONE SODIUM PHOSPHATE 10 MG/ML IJ SOLN
INTRAMUSCULAR | Status: AC
  Filled 2019-06-03: qty 1

## 2019-06-03 MED ORDER — BUPIVACAINE HCL (PF) 0.25 % IJ SOLN
INTRAMUSCULAR | Status: DC | PRN
  Administered 2019-06-03: 10 mL

## 2019-06-03 MED ORDER — BUPIVACAINE HCL (PF) 0.5 % IJ SOLN
INTRAMUSCULAR | Status: AC
  Filled 2019-06-03: qty 30

## 2019-06-03 MED ORDER — FENTANYL CITRATE (PF) 100 MCG/2ML IJ SOLN
INTRAMUSCULAR | Status: DC | PRN
  Administered 2019-06-03 (×3): 25 ug via INTRAVENOUS

## 2019-06-03 MED ORDER — PROMETHAZINE HCL 12.5 MG PO TABS: 12.5000 mg | ORAL_TABLET | Freq: Four times a day (QID) | ORAL | 0 refills | Status: AC | PRN

## 2019-06-03 MED ORDER — LACTATED RINGERS IV SOLN: INTRAVENOUS | Status: DC

## 2019-06-03 MED ORDER — CEFAZOLIN SODIUM-DEXTROSE 2-4 GM/100ML-% IV SOLN
2.0000 g | INTRAVENOUS | Status: AC
  Administered 2019-06-03: 2 g via INTRAVENOUS

## 2019-06-03 MED ORDER — FAMOTIDINE 20 MG PO TABS: 20.0000 mg | ORAL_TABLET | Freq: Once | ORAL | Status: AC

## 2019-06-03 MED ORDER — PHENYLEPHRINE HCL (PRESSORS) 10 MG/ML IV SOLN
INTRAVENOUS | Status: DC | PRN
  Administered 2019-06-03 (×5): 100 ug via INTRAVENOUS

## 2019-06-03 MED ORDER — EPHEDRINE SULFATE 50 MG/ML IJ SOLN
INTRAMUSCULAR | Status: AC
  Filled 2019-06-03: qty 1

## 2019-06-03 MED ORDER — LIDOCAINE HCL (PF) 2 % IJ SOLN
INTRAMUSCULAR | Status: AC
  Filled 2019-06-03: qty 10

## 2019-06-03 MED ORDER — SUCCINYLCHOLINE CHLORIDE 20 MG/ML IJ SOLN
INTRAMUSCULAR | Status: AC
  Filled 2019-06-03: qty 1

## 2019-06-03 MED ORDER — ROCURONIUM BROMIDE 50 MG/5ML IV SOLN
INTRAVENOUS | Status: AC
  Filled 2019-06-03: qty 1

## 2019-06-03 MED ORDER — ACETAMINOPHEN 10 MG/ML IV SOLN
INTRAVENOUS | Status: DC | PRN
  Administered 2019-06-03: 1000 mg via INTRAVENOUS

## 2019-06-03 MED ORDER — ONDANSETRON HCL 4 MG/2ML IJ SOLN: 4.0000 mg | Freq: Four times a day (QID) | INTRAMUSCULAR | Status: DC | PRN

## 2019-06-03 MED ORDER — CEFAZOLIN SODIUM-DEXTROSE 2-4 GM/100ML-% IV SOLN
INTRAVENOUS | Status: AC
  Filled 2019-06-03: qty 100

## 2019-06-03 MED ORDER — PROPOFOL 10 MG/ML IV BOLUS
INTRAVENOUS | Status: DC | PRN
  Administered 2019-06-03: 130 mg via INTRAVENOUS

## 2019-06-03 MED ORDER — FENTANYL CITRATE (PF) 100 MCG/2ML IJ SOLN
INTRAMUSCULAR | Status: AC
  Administered 2019-06-03: 25 ug via INTRAVENOUS
  Filled 2019-06-03: qty 2

## 2019-06-03 MED ORDER — SUGAMMADEX SODIUM 200 MG/2ML IV SOLN
INTRAVENOUS | Status: DC | PRN
  Administered 2019-06-03: 200 mg via INTRAVENOUS

## 2019-06-03 MED ORDER — BUPIVACAINE HCL (PF) 0.25 % IJ SOLN
INTRAMUSCULAR | Status: AC
  Filled 2019-06-03: qty 30

## 2019-06-03 MED ORDER — ACETAMINOPHEN 10 MG/ML IV SOLN
INTRAVENOUS | Status: AC
  Filled 2019-06-03: qty 100

## 2019-06-03 MED ORDER — ONDANSETRON HCL 4 MG PO TABS: 4.0000 mg | ORAL_TABLET | Freq: Four times a day (QID) | ORAL | Status: DC | PRN

## 2019-06-03 SURGICAL SUPPLY — 55 items
BENZOIN TINCTURE PRP APPL 2/3 (GAUZE/BANDAGES/DRESSINGS) ×2 IMPLANT
BIT DRILL 2.5X2.75 QC CALB (BIT) ×2 IMPLANT
BIT DRILL CALIBRATED 2.7 (BIT) ×2 IMPLANT
BLADE SURG 15 STRL LF DISP TIS (BLADE) ×1 IMPLANT
BLADE SURG 15 STRL SS (BLADE) ×1
BNDG COHESIVE 4X5 TAN STRL (GAUZE/BANDAGES/DRESSINGS) ×2 IMPLANT
BNDG CONFORM 2 STRL LF (GAUZE/BANDAGES/DRESSINGS) ×2 IMPLANT
BNDG CONFORM 3 STRL LF (GAUZE/BANDAGES/DRESSINGS) ×2 IMPLANT
BNDG ELASTIC 4X5.8 VLCR NS LF (GAUZE/BANDAGES/DRESSINGS) ×2 IMPLANT
BNDG ESMARK 4X12 TAN STRL LF (GAUZE/BANDAGES/DRESSINGS) ×2 IMPLANT
BNDG GAUZE 4.5X4.1 6PLY STRL (MISCELLANEOUS) ×2 IMPLANT
CANISTER SUCT 1200ML W/VALVE (MISCELLANEOUS) ×2 IMPLANT
COVER WAND RF STERILE (DRAPES) ×2 IMPLANT
CUFF TOURN SGL QUICK 18X4 (TOURNIQUET CUFF) ×2 IMPLANT
CUFF TOURN SGL QUICK 24 (TOURNIQUET CUFF) ×1
CUFF TRNQT CYL 24X4X16.5-23 (TOURNIQUET CUFF) ×1 IMPLANT
DURAPREP 26ML APPLICATOR (WOUND CARE) ×2 IMPLANT
ELECT REM PT RETURN 9FT ADLT (ELECTROSURGICAL) ×2
ELECTRODE REM PT RTRN 9FT ADLT (ELECTROSURGICAL) ×1 IMPLANT
GAUZE SPONGE 4X4 12PLY STRL (GAUZE/BANDAGES/DRESSINGS) ×2 IMPLANT
GAUZE XEROFORM 1X8 LF (GAUZE/BANDAGES/DRESSINGS) ×2 IMPLANT
GLOVE BIO SURGEON STRL SZ7.5 (GLOVE) ×2 IMPLANT
GLOVE INDICATOR 8.0 STRL GRN (GLOVE) ×2 IMPLANT
GOWN STRL REUS W/ TWL LRG LVL3 (GOWN DISPOSABLE) ×2 IMPLANT
GOWN STRL REUS W/TWL LRG LVL3 (GOWN DISPOSABLE) ×2
K-WIRE ACE 1.6X6 (WIRE) ×6
KIT TURNOVER KIT A (KITS) ×2 IMPLANT
KWIRE ACE 1.6X6 (WIRE) ×3 IMPLANT
LABEL OR SOLS (LABEL) ×2 IMPLANT
NEEDLE HYPO 25X1 1.5 SAFETY (NEEDLE) ×4 IMPLANT
NS IRRIG 500ML POUR BTL (IV SOLUTION) ×2 IMPLANT
PACK EXTREMITY ARMC (MISCELLANEOUS) ×2 IMPLANT
PAD ABD DERMACEA PRESS 5X9 (GAUZE/BANDAGES/DRESSINGS) ×2 IMPLANT
PAD PREP 24X41 OB/GYN DISP (PERSONAL CARE ITEMS) ×2 IMPLANT
PLATE LOCK CALC MIS 2H LG RT (Plate) ×2 IMPLANT
RASP SM TEAR CROSS CUT (RASP) IMPLANT
SCREW LOCK 3.5X36 DIST TIB (Screw) ×2 IMPLANT
SCREW LOCK CORT STAR 3.5X28 (Screw) ×2 IMPLANT
SCREW LOCK CORT STAR 3.5X34 (Screw) ×2 IMPLANT
SCREW LP 3.5X38MM (Screw) ×4 IMPLANT
SCREW NL LP 36X3.5 (Screw) ×2 IMPLANT
SPLINT CAST 1 STEP 4X30 (MISCELLANEOUS) IMPLANT
SPLINT FAST PLASTER 5X30 (CAST SUPPLIES)
SPLINT PLASTER CAST FAST 5X30 (CAST SUPPLIES) IMPLANT
SPONGE LAP 18X18 RF (DISPOSABLE) ×2 IMPLANT
STAPLER SKIN PROX 35W (STAPLE) ×2 IMPLANT
STOCKINETTE M/LG 89821 (MISCELLANEOUS) ×2 IMPLANT
STRIP CLOSURE SKIN 1/4X4 (GAUZE/BANDAGES/DRESSINGS) ×2 IMPLANT
SUT ETHILON 3-0 FS-10 30 BLK (SUTURE) ×4
SUT VIC AB 2-0 CT1 27 (SUTURE) ×1
SUT VIC AB 2-0 CT1 TAPERPNT 27 (SUTURE) ×1 IMPLANT
SUT VIC AB 3-0 SH 27 (SUTURE) ×1
SUT VIC AB 3-0 SH 27X BRD (SUTURE) ×1 IMPLANT
SUTURE EHLN 3-0 FS-10 30 BLK (SUTURE) ×2 IMPLANT
WIRE Z .062 C-WIRE SPADE TIP (WIRE) IMPLANT

## 2019-06-03 NOTE — Discharge Instructions (Signed)
[facility]  POST OPERATIVE INSTRUCTIONS FOR DR. TROXLER, DR. FOWLER, AND [facility] PODIATRY DEPART[facility]   1. Take your medication as prescribed.  Pain medication should be taken only as needed.  2. Keep the dressing clean, dry and intact.  3. Keep your foot elevated above the heart level for the first 48 hours.  4. Walking to the bathroom and brief periods of walking are acceptable, unless we have instructed you to be non-weight bearing.  5. Always wear your post-op shoe when walking.  Always use your crutches if you are to be non-weight bearing.  6. Do not take a shower. Baths are permissible as long as the foot is kept out of the water.   7. Every hour you are awake:  - Bend your knee 15 times.   8. Call [facility] ([phone]) if any of the following problems occur: - You develop a temperature or fever. - The bandage becomes saturated with blood. - Medication does not stop your pain. - Injury of the foot occurs. - Any symptoms of infection including redness, odor, or red streaks running from wound.   AMBULATORY SURGERY  DISCHARGE INSTRUCTIONS   1) The drugs that you were given will stay in your system until tomorrow so for the next 24 hours you should not:  A) Drive an automobile B) Make any legal decisions C) Drink any alcoholic beverage   2) You may resume regular meals tomorrow.  Today it is better to start with liquids and gradually work up to solid foods.  You may eat anything you prefer, but it is better to start with liquids, then soup and crackers, and gradually work up to solid foods.   3) Please notify your doctor immediately if you have any unusual bleeding, trouble breathing, redness and pain at the surgery site, drainage, fever, or pain not relieved by medication.    4) Additional Instructions:        Please contact your physician with any problems or Same Day Surgery at  [phone], Monday through Friday 6 am to 4 pm, or [facility] [place] Main number at [phone]. 

## 2019-06-03 NOTE — Transfer of Care (Signed)
Immediate Anesthesia Transfer of Care Note  Patient: Amanda Holder  Procedure(s) Performed: ORIF CALCANEUS RIGHT (Right Foot)  Patient Location: PACU  Anesthesia Type:General  Level of Consciousness: sedated  Airway & Oxygen Therapy: Patient Spontanous Breathing and Patient connected to face mask oxygen  Post-op Assessment: Report given to RN and Post -op Vital signs reviewed and stable  Post vital signs: Reviewed and stable  Last Vitals:  Vitals Value Taken Time  BP    Temp    Pulse 87 06/03/19 1336  Resp 19 06/03/19 1336  SpO2 97 % 06/03/19 1336  Vitals shown include unvalidated device data.  Last Pain:  Vitals:   06/03/19 0949  TempSrc: Tympanic  PainSc: 2       Patients Stated Pain Goal: 2 (06/03/19 0949)  Complications: No apparent anesthesia complications
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
## 2019-06-03 NOTE — Anesthesia Procedure Notes (Signed)
Procedure Name: Intubation Date/Time: 06/03/2019 10:58 AM Performed by: Almeta Monas, CRNA Pre-anesthesia Checklist: Patient identified, Patient being monitored, Timeout performed, Emergency Drugs available and Suction available Patient Re-evaluated:Patient Re-evaluated prior to induction Oxygen Delivery Method: Circle system utilized Preoxygenation: Pre-oxygenation with 100% oxygen Induction Type: IV induction Ventilation: Mask ventilation without difficulty Laryngoscope Size: 3 and McGraph Grade View: Grade I Tube type: Oral Tube size: 7.0 mm Number of attempts: 1 Airway Equipment and Method: Stylet and Video-laryngoscopy Placement Confirmation: ETT inserted through vocal cords under direct vision,  positive ETCO2 and breath sounds checked- equal and bilateral Secured at: 21 cm Tube secured with: Tape Dental Injury: Teeth and Oropharynx as per pre-operative assessment

## 2019-06-03 NOTE — Op Note (Signed)
Operative note   Surgeon:Stephfon Bovey [employer] forces logistics/support/administrative officer: Maralyn Sago PA student    Preop diagnosis: Right calcaneal fracture    Postop diagnosis: Same    Procedure: ORIF right calcaneal fracture    EBL: Minimal    Anesthesia:local and general local consisted of 10 mL of 0.25% bupivacaine and 20 mL of Exparel long-acting anesthetic infiltrated along the surgical site    Hemostasis: Thigh tourniquet inflated 250 mmHg for approximately 106 minutes    Specimen: None    Complications: None    Operative indications:Amanda Holder is an 54 y.o. that presents today for surgical intervention.  The risks/benefits/alternatives/complications have been discussed and consent has been given.    Procedure:  Patient was brought into the OR and placed on the operating table in thelateral position. After anesthesia was obtained theright lower extremity was prepped and draped in usual sterile fashion.  Attention was directed along the subtalar joint region where a subtalar incision was performed from the calcaneocuboid joint to the posterior aspect of the fibula.  Sharp and blunt dissection carried down to the joint.  The joint was then entered.  The surrounding subperiosteal dissection was undertaken along the lateral aspect of the calcaneus.  At this point the intra-articular joint depression fracture was noted.  There was a large posterior facet piece that was impacted deep into the calcaneus.  Was able to decompress this out of the calcaneal body and realigned this to the medial process of the calcaneus under the talus.  This was then stabilized with K wires.  The remaining lateral wall of the calcaneus was then placed back into an anatomic position and stabilized temporarily with K wires.  Next a large 2 hole minimal incision calcaneal plate from [employer] was then placed along the lateral aspect of the calcaneus.  The sinus initial sustentacular screw was placed to stabilize the subtalar joint with good  anatomic alignment noted.  Afterwards multiple holes were then filled with a combination of locking and nonlocking screws to stabilize the calcaneus as well as stabilize the plate to the calcaneal fracture site.  Good alignment was noted in all planes.  The wounds were flushed with copious amounts of irrigation.  Layered closure was then performed with combination of 2-0 and 3-0 Vicryl and a 3-0 nylon for skin.  The patient was placed in a posterior splint with the foot at a 90 degree position.    Patient tolerated the procedure and anesthesia well.  Was transported from the OR to the PACU with all vital signs stable and vascular status intact. To be discharged per routine protocol.  Will follow up in approximately 1 week in the outpatient clinic.  Prescription for Percocet was prescribed to the patient's pharmacy.  Preoperatively we discussed this directly with the patient and she stated that she cannot tolerate the medication it does cause some anxiety.  We discussed the need for some type of postoperative pain management and she was willing to try the medication.

## 2019-06-03 NOTE — H&P (Signed)
HISTORY AND PHYSICAL INTERVAL NOTE:  06/03/2019  10:33 AM  Amanda Holder  has presented today for surgery, with the diagnosis of S92.011A CLOSED DISPLACED FRACTURE RIGHT CALCANEUS.  The various methods of treatment have been discussed with the patient.  No guarantees were given.  After consideration of risks, benefits and other options for treatment, the patient has consented to surgery.  I have reviewed the patients' chart and labs.     A history and physical examination was performed in my office.  The patient was reexamined.  There have been no changes to this history and physical examination.  Gwyneth Revels A

## 2019-06-04 NOTE — Anesthesia Postprocedure Evaluation (Signed)
Anesthesia Post Note  Patient: Amanda Holder  Procedure(s) Performed: ORIF CALCANEUS RIGHT (Right Foot)  Patient location during evaluation: PACU Anesthesia Type: General Level of consciousness: awake and alert and oriented Pain management: pain level controlled Vital Signs Assessment: post-procedure vital signs reviewed and stable Respiratory status: spontaneous breathing Cardiovascular status: blood pressure returned to baseline Anesthetic complications: no     Last Vitals:  Vitals:   06/03/19 1430 06/03/19 1517  BP: 140/77 (!) 141/75  Pulse: 79 77  Resp: 16 18  Temp: 36.4 C   SpO2: 100% 100%    Last Pain:  Vitals:   06/03/19 1517  TempSrc:   PainSc: 3                  Norman Bier

## 2020-03-14 ENCOUNTER — Ambulatory Visit
Admission: RE | Admit: 2020-03-14 | Discharge: 2020-03-14 | Disposition: A | Discharge status: Home / Self Care | Payer: Self-pay | Source: Ambulatory Visit | Attending: Oncology | Admitting: Oncology

## 2020-03-14 ENCOUNTER — Ambulatory Visit: Payer: Self-pay | Attending: Oncology | Admitting: *Deleted

## 2020-03-14 ENCOUNTER — Encounter: Payer: Self-pay | Admitting: *Deleted

## 2020-03-14 ENCOUNTER — Other Ambulatory Visit: Payer: Self-pay

## 2020-03-14 VITALS — BP 147/82 | HR 75 | Temp 98.7°F | Ht 68.17 in | Wt 195.1 lb

## 2020-03-14 DIAGNOSIS — Z0000 Encounter for general adult medical examination without abnormal findings: Secondary | ICD-10-CM

## 2020-03-14 NOTE — Patient Instructions (Signed)
Gave patient hand-out, Women Staying Healthy, Active and Well from [facility], with education on breast health, pap smears, heart and colon health. 

## 2020-03-14 NOTE — Progress Notes (Signed)
  Subjective:     Patient ID: Amanda Holder, female   DOB: 10-17-1965, 54 y.o.   MRN: [id number]  HPI   [facility] Medical History Record - 03/14/20 1015      Breast History   Screening cycle Rescreen    CBE Date 03/15/19    Provider (CBE) [facility]    Initial Mammogram 03/14/20    Last Mammogram Annual    Last Mammogram Date 03/15/19    Provider (Mammogram)  Delford Field    Recent Breast Symptoms None      Breast Cancer History   Breast Cancer History No personal or family history    Comments/Details PGM had ovarian cancer      Previous History of Breast Problems   Breast Surgery or Biopsy None    Breast Implants N/A    BSE Done Never      Gynecological/Obstetrical History   LMP 05/21/19    Is there any chance that the client could be pregnant?  No    Age at menarche 89    Age at menopause n/a    PAP smear history Annually    Date of last PAP  03/03/16    Provider (PAP) [facility]    Age at first live birth n/a    DES Exposure Unkown    Cervical, Uterine or Ovarian cancer No    Family history of Cervial, Uterine or Ovarian cancer No    Hysterectomy No    Cervix removed No    Ovaries removed No    Laser/Cryosurgery No    Current method of birth control None    Current method of Estrogen/Hormone replacement None    Smoking history None             Review of Systems     Objective:   Physical Exam Chest:     Breasts:        Right: No swelling, bleeding, inverted nipple, mass, nipple discharge, skin change or tenderness.        Left: No swelling, bleeding, inverted nipple, mass, nipple discharge, skin change or tenderness.  Lymphadenopathy:     Upper Body:     Right upper body: No supraclavicular or axillary adenopathy.     Left upper body: No supraclavicular or axillary adenopathy.        Assessment:     54 year old White female returns to [facility] for annual screening.  Clinical breast exam unremarkable.  Taught self breast awareness.  Last pap on 04/03/16 was negative  without HPV co-testing.  Offered opportunity to obtain pap, but patient states she is scheduled to get her pap smear in April with her PCP.  States she does not want to have that completed today.  Patient has been screened for eligibility.  She does not have any insurance, Medicare or Medicaid.  She also meets financial eligibility.   Risk Assessment    Risk Scores      03/14/2020 03/15/2019   Last edited by: Alta Corning, CMA Jim Like, RN   5-year risk: 1.4 % 1.3 %   Lifetime risk: 8.9 % 9.1 %            Plan:     Screening mammogram ordered.  Will follow up per [facility] protocol.

## 2020-03-16 ENCOUNTER — Other Ambulatory Visit: Payer: Self-pay

## 2020-03-16 DIAGNOSIS — N63 Unspecified lump in unspecified breast: Secondary | ICD-10-CM

## 2020-03-16 DIAGNOSIS — R922 Inconclusive mammogram: Secondary | ICD-10-CM

## 2020-03-16 DIAGNOSIS — N6310 Unspecified lump in the right breast, unspecified quadrant: Secondary | ICD-10-CM

## 2020-03-28 ENCOUNTER — Ambulatory Visit
Admission: RE | Admit: 2020-03-28 | Discharge: 2020-03-28 | Disposition: A | Discharge status: Home / Self Care | Payer: Self-pay | Source: Ambulatory Visit | Attending: Oncology | Admitting: Oncology

## 2020-03-28 ENCOUNTER — Other Ambulatory Visit: Payer: Self-pay

## 2020-03-28 ENCOUNTER — Ambulatory Visit: Payer: Self-pay | Attending: Oncology | Admitting: *Deleted

## 2020-03-28 VITALS — BP 147/83 | Temp 98.5°F

## 2020-03-28 DIAGNOSIS — R922 Inconclusive mammogram: Secondary | ICD-10-CM | POA: Insufficient documentation

## 2020-03-28 DIAGNOSIS — Z0000 Encounter for general adult medical examination without abnormal findings: Secondary | ICD-10-CM

## 2020-03-28 NOTE — Progress Notes (Signed)
  Subjective:     Patient ID: Silas Sacramento, female   DOB: 04-23-1966, 54 y.o.   MRN: [id number]  HPI       Review of Systems     Objective:   Physical Exam Genitourinary:    Exam position: Lithotomy position.     Labia:        Right: No rash, tenderness, lesion or injury.        Left: No rash, tenderness, lesion or injury.      Urethra: No prolapse or urethral pain.     Vagina: No signs of injury and foreign body. Vaginal discharge present. No erythema, tenderness, bleeding, lesions or prolapsed vaginal walls.     Cervix: No cervical motion tenderness, discharge, friability, lesion, erythema, cervical bleeding or eversion.     Uterus: Not deviated and not enlarged.      Adnexa:        Right: No mass.         Left: No mass.       Comments: Refused rectal exam -  White non-odorous vaginal discharge noted       Assessment:     54 year old female returns today for pap smear only.  She was seen in [facility] on 03/14/20 for breast exam and mammogram at time.  She had origninally refused a pap smear at that initial encounter.  She is also scheduled for her additional views and ultrasound of the right breast for asymmetry.  Specimen collected for pap smear without difficulty.  Patient refused rectal exam.  Patient has been screened for eligibility.  She does not have any insurance, Medicare or Medicaid.  She also meets financial eligibility.      Plan:     Specimen for pap sent to the lab.  Will follow up per [facility] protocol.

## 2020-03-28 NOTE — Progress Notes (Deleted)
  Subjective:     Patient ID: Amanda Holder, female   DOB: 29-Nov-1965, 54 y.o.   MRN: [id number]  HPI   Review of Systems     Objective:   Physical Exam     Assessment:     ***    Plan:     ***

## 2020-03-30 LAB — IGP, APTIMA HPV: HPV Aptima: NEGATIVE

## 2020-04-04 ENCOUNTER — Other Ambulatory Visit: Payer: Self-pay | Admitting: *Deleted

## 2020-04-05 ENCOUNTER — Encounter: Payer: Self-pay | Admitting: *Deleted

## 2020-04-05 ENCOUNTER — Other Ambulatory Visit: Payer: Self-pay | Admitting: *Deleted

## 2020-04-05 DIAGNOSIS — N6489 Other specified disorders of breast: Secondary | ICD-10-CM

## 2020-04-05 NOTE — Progress Notes (Signed)
Letter mailed to inform patient of her mammogram and pap smear results.  Next mammogram scheduled for 09/28/20 @ 10:20.

## 2020-09-28 ENCOUNTER — Ambulatory Visit
Admission: RE | Admit: 2020-09-28 | Discharge: 2020-09-28 | Disposition: A | Discharge status: Home / Self Care | Payer: Self-pay | Source: Ambulatory Visit | Attending: Oncology | Admitting: Oncology

## 2020-09-28 ENCOUNTER — Other Ambulatory Visit: Payer: Self-pay

## 2020-09-28 DIAGNOSIS — N6489 Other specified disorders of breast: Secondary | ICD-10-CM | POA: Insufficient documentation

## 2020-10-11 ENCOUNTER — Other Ambulatory Visit: Payer: Self-pay | Admitting: *Deleted

## 2020-10-11 ENCOUNTER — Encounter: Payer: Self-pay | Admitting: *Deleted

## 2020-10-11 DIAGNOSIS — N6489 Other specified disorders of breast: Secondary | ICD-10-CM

## 2020-10-11 NOTE — Progress Notes (Signed)
Letter mailed to inform patient of her mammogram results and need for 6 month follow up, at which time she will be due for her annual exam.  Appointment scheduled for 03/20/21 at 8:30 [facility] and 10:40 for her mammo.

## 2021-03-18 NOTE — Progress Notes (Signed)
Patient pre-screened for [facility] eligibility due to COVID 19 precautions. Two patient identifiers used for verification that I was speaking to correct patient.  Patient to Present directly to [facility] 03/20/21 for [facility] diagnostic  mammogram  annual and 6 month follow-up Birads 3. Orders are submitted.

## 2021-03-20 ENCOUNTER — Ambulatory Visit: Payer: Self-pay | Attending: Oncology

## 2021-03-20 ENCOUNTER — Ambulatory Visit
Admission: RE | Admit: 2021-03-20 | Discharge: 2021-03-20 | Disposition: A | Discharge status: Home / Self Care | Payer: Self-pay | Source: Ambulatory Visit | Attending: Oncology | Admitting: Oncology

## 2021-03-20 ENCOUNTER — Other Ambulatory Visit: Payer: Self-pay

## 2021-03-20 DIAGNOSIS — N6489 Other specified disorders of breast: Secondary | ICD-10-CM

## 2021-03-20 DIAGNOSIS — N6310 Unspecified lump in the right breast, unspecified quadrant: Secondary | ICD-10-CM

## 2021-03-20 NOTE — Progress Notes (Signed)
Letter mailed from [facility] to notify of Birads 3 mammogram results.  Patient to return in one year for annual screening and diagnostic mammogram for 2 years stability.  Copy to [facility].

## 2022-01-30 ENCOUNTER — Other Ambulatory Visit: Payer: Self-pay

## 2022-01-30 DIAGNOSIS — N6489 Other specified disorders of breast: Secondary | ICD-10-CM

## 2022-03-19 ENCOUNTER — Ambulatory Visit: Payer: Self-pay

## 2022-03-24 ENCOUNTER — Ambulatory Visit
Admission: RE | Admit: 2022-03-24 | Discharge: 2022-03-24 | Disposition: A | Discharge status: Home / Self Care | Payer: Self-pay | Source: Ambulatory Visit | Attending: Obstetrics and Gynecology | Admitting: Obstetrics and Gynecology

## 2022-03-24 ENCOUNTER — Ambulatory Visit: Payer: Self-pay | Attending: Hematology and Oncology | Admitting: Hematology and Oncology

## 2022-03-24 VITALS — BP 134/79 | Wt 173.8 lb

## 2022-03-24 DIAGNOSIS — N6310 Unspecified lump in the right breast, unspecified quadrant: Secondary | ICD-10-CM

## 2022-03-24 DIAGNOSIS — N6489 Other specified disorders of breast: Secondary | ICD-10-CM | POA: Insufficient documentation

## 2022-03-24 NOTE — Patient Instructions (Signed)
Taught Amanda Holder about breast self awareness. Patient did not need a Pap smear today due to last Pap smear was in 2021 and was negative per patient. Let her know [facility] will cover Pap smears every 5 years unless has a history of abnormal Pap smears. [facility]. Appointment scheduled for 03/24/22. Patient aware of appointment and will be there. Let patient know will follow up with her within the next couple weeks with results. Amanda Holder verbalized understanding. She will return to clinic in one year for breast follow up and repeat pap in 2026.  Melodye Ped, NP @T @ 9:23 AM

## 2022-03-24 NOTE — Progress Notes (Signed)
[name] is a 56 y.o. female who presents to [facility] clinic today with no complaints .    Pap Smear: Pap not smear completed today. Last Pap smear was 2021 at [facility] and was normal. Per patient has history of an abnormal Pap smear 20 years ago with at least three normal since then.  Last Pap smear result is available in Epic.   Physical exam: Breasts Breasts symmetrical. No skin abnormalities bilateral breasts. No nipple retraction bilateral breasts. No nipple discharge bilateral breasts. No lymphadenopathy. No lumps palpated bilateral breasts.     MS DIGITAL DIAG TOMO BILAT  Result Date: 03/20/2021 CLINICAL DATA:  BI-RADS 3 follow-up of a RIGHT breast asymmetry, initiated 10/21. No sonographic correlate. EXAM: DIGITAL DIAGNOSTIC BILATERAL MAMMOGRAM WITH TOMOSYNTHESIS AND CAD TECHNIQUE: Bilateral digital diagnostic mammography and breast tomosynthesis was performed. The images were evaluated with computer-aided detection. COMPARISON:  Previous exam(s). ACR Breast Density Category d: The breast tissue is extremely dense, which lowers the sensitivity of mammography. FINDINGS: Diagnostic images of the RIGHT breast demonstrate mammographic stability of a subtle RIGHT breast asymmetry in the inner breast at middle to posterior depth on CC view only, likely reflecting patient's baseline configuration of fibroglandular tissue. No new suspicious findings in the RIGHT breast. No suspicious mass, distortion, or microcalcifications are identified to suggest presence of malignancy in the LEFT breast. IMPRESSION: 1. Stable probably benign RIGHT breast asymmetry seen on CC view only. Recommend follow-up diagnostic mammogram in 1 year. This will establish 2 years of definitive stability. 2. No mammographic evidence of malignancy in LEFT breast. RECOMMENDATION: Bilateral diagnostic mammogram with RIGHT ultrasound if deemed necessary in 1 year. I have discussed the findings and recommendations with the  patient. If applicable, a reminder letter will be sent to the patient regarding the next appointment. BI-RADS CATEGORY  3: Probably benign. Electronically Signed   By: Meda Klinefelter M.D.   On: 03/20/2021 11:00  MS DIGITAL DIAG TOMO UNI RIGHT  Result Date: 09/28/2020 CLINICAL DATA:  First six-month follow-up for probably benign asymmetry in the RIGHT breast. EXAM: DIGITAL DIAGNOSTIC UNILATERAL RIGHT MAMMOGRAM WITH TOMOSYNTHESIS AND CAD TECHNIQUE: Right digital diagnostic mammography and breast tomosynthesis was performed. The images were evaluated with computer-aided detection. COMPARISON:  Previous exam(s). ACR Breast Density Category d: The breast tissue is extremely dense, which lowers the sensitivity of mammography. FINDINGS: No suspicious mass, distortion, or microcalcifications are identified to suggest presence of malignancy. The asymmetry previously noted in the MEDIAL portion of the RIGHT breast is less apparent today. No sonographic correlate was identified for the mammographic asymmetry at the time of previous evaluation. IMPRESSION: Stable or improved appearance of asymmetry in the MEDIAL RIGHT breast. No mammographic evidence for malignancy. RECOMMENDATION: Bilateral diagnostic mammogram is recommended in 6 months. I have discussed the findings and recommendations with the patient. If applicable, a reminder letter will be sent to the patient regarding the next appointment. BI-RADS CATEGORY  3: Probably benign. Electronically Signed   By: Norva Pavlov M.D.   On: 09/28/2020 10:42   MS DIGITAL DIAG TOMO UNI RIGHT  Result Date: 03/28/2020 CLINICAL DATA:  56 year old female recalled from screening mammogram dated 03/14/2020 for a possible right breast asymmetry. EXAM: DIGITAL DIAGNOSTIC RIGHT MAMMOGRAM WITH CAD AND TOMO ULTRASOUND RIGHT BREAST COMPARISON:  Previous exam(s). ACR Breast Density Category d: The breast tissue is extremely dense, which lowers the sensitivity of mammography.  FINDINGS: Previously described, possible asymmetry in the medial right breast at middle depth is seen on the cc  projection only partially effaces on today's additional views. This area has the appearance of a focal area of dense fibroglandular tissue. Precautionary ultrasound was performed. Mammographic images were processed with CAD. Targeted ultrasound is performed, showing dense fibroglandular tissue without focal or suspicious sonographic abnormality. Extensive evaluation of the entire medial right breast was performed. IMPRESSION: Probably benign right breast asymmetry without ultrasound correlate. This area partially effaces on today's additional views and has the appearance of focally dense fibroglandular tissue. Precautionary short-term follow-up recommended. RECOMMENDATION: Diagnostic right breast mammogram and possible ultrasound in 6 months. I have discussed the findings and recommendations with the patient. If applicable, a reminder letter will be sent to the patient regarding the next appointment. BI-RADS CATEGORY  3: Probably benign. Electronically Signed   By: Sande Brothers M.D.   On: 03/28/2020 11:33   MS DIGITAL SCREENING TOMO BILATERAL  Result Date: 03/16/2020 CLINICAL DATA:  Screening. EXAM: DIGITAL SCREENING BILATERAL MAMMOGRAM WITH TOMO AND CAD COMPARISON:  Previous exam(s). ACR Breast Density Category d: The breast tissue is extremely dense, which lowers the sensitivity of mammography. FINDINGS: In the right breast, a possible asymmetry warrants further evaluation. In the left breast, no findings suspicious for malignancy. Images were processed with CAD. IMPRESSION: Further evaluation is suggested for possible asymmetry in the right breast. RECOMMENDATION: Diagnostic mammogram and possibly ultrasound of the right breast. (Code:FI-R-64M) The patient will be contacted regarding the findings, and additional imaging will be scheduled. BI-RADS CATEGORY  0: Incomplete. Need additional imaging  evaluation and/or prior mammograms for comparison. Electronically Signed   By: Emmaline Kluver M.D.   On: 03/16/2020 14:45   MM 3D SCREEN BREAST BILATERAL  Result Date: 03/15/2019 CLINICAL DATA:  Screening. EXAM: DIGITAL SCREENING BILATERAL MAMMOGRAM WITH TOMO AND CAD COMPARISON:  Previous exam(s). ACR Breast Density Category d: The breast tissue is extremely dense, which lowers the sensitivity of mammography FINDINGS: There are no findings suspicious for malignancy. Images were processed with CAD. IMPRESSION: No mammographic evidence of malignancy. A result letter of this screening mammogram will be mailed directly to the patient. RECOMMENDATION: Screening mammogram in one year. (Code:[id number]-01Y) BI-RADS CATEGORY  1: Negative. Electronically Signed   By: Frederico Hamman M.D.   On: 03/15/2019 14:43   MS DIGITAL SCREENING TOMO BILATERAL  Result Date: 06/30/2017 CLINICAL DATA:  Screening. EXAM: 2D DIGITAL SCREENING BILATERAL MAMMOGRAM WITH 3D TOMO WITH CAD COMPARISON:  Previous exam(s). ACR Breast Density Category d: The breast tissue is extremely dense, which lowers the sensitivity of mammography. FINDINGS: There are no findings suspicious for malignancy. Images were processed with CAD. IMPRESSION: No mammographic evidence of malignancy. A result letter of this screening mammogram will be mailed directly to the patient. RECOMMENDATION: Screening mammogram in one year. (Code:[id number]) BI-RADS CATEGORY  1: Negative. Electronically Signed   By: Edwin Cap M.D.   On: 06/30/2017 10:25      Pelvic/Bimanual Pap is not indicated today    Smoking History: Patient has never smoked and was not referred to quit line.    Patient Navigation: Patient education provided. Access to services provided for patient through [facility] program. No interpreter provided. No transportation provided   Colorectal Cancer Screening: Per patient has had colonoscopy completed on 06/07/21  No complaints today.    Breast and  Cervical Cancer Risk Assessment: Patient does not have family history of breast cancer, known genetic mutations, or radiation treatment to the chest before age 28. Patient does not have history of cervical dysplasia, immunocompromised, or DES exposure in-utero.  Risk  Assessment   No risk assessment data for the current encounter  Risk Scores       03/20/2021   Last edited by: Theodore Demark, RN   5-year risk: 1.4 %   Lifetime risk: 8.7 %            A: [facility] exam without pap smear No complaints benign exam. Continued follow up of most likely benign asymmetry.   P: [facility] to [facility] for a [facility] mammogram. Appointment scheduled 03/24/2022.  Dayton Scrape A, NP 03/24/2022 9:07 AM

## 2023-03-20 IMAGING — MG MM DIGITAL DIAGNOSTIC UNILAT*R* W/ TOMO W/ CAD
4 series · 4 of 12 positions shown · non-contrast
Comparison: Previous exam(s).

CLINICAL DATA: First six-month follow-up for probably benign
asymmetry in the RIGHT breast.

EXAM:
DIGITAL DIAGNOSTIC UNILATERAL RIGHT MAMMOGRAM WITH TOMOSYNTHESIS AND
CAD
TECHNIQUE: Right digital diagnostic mammography and breast tomosynthesis was
performed. The images were evaluated with computer-aided detection.

[R MLO synth-2D]
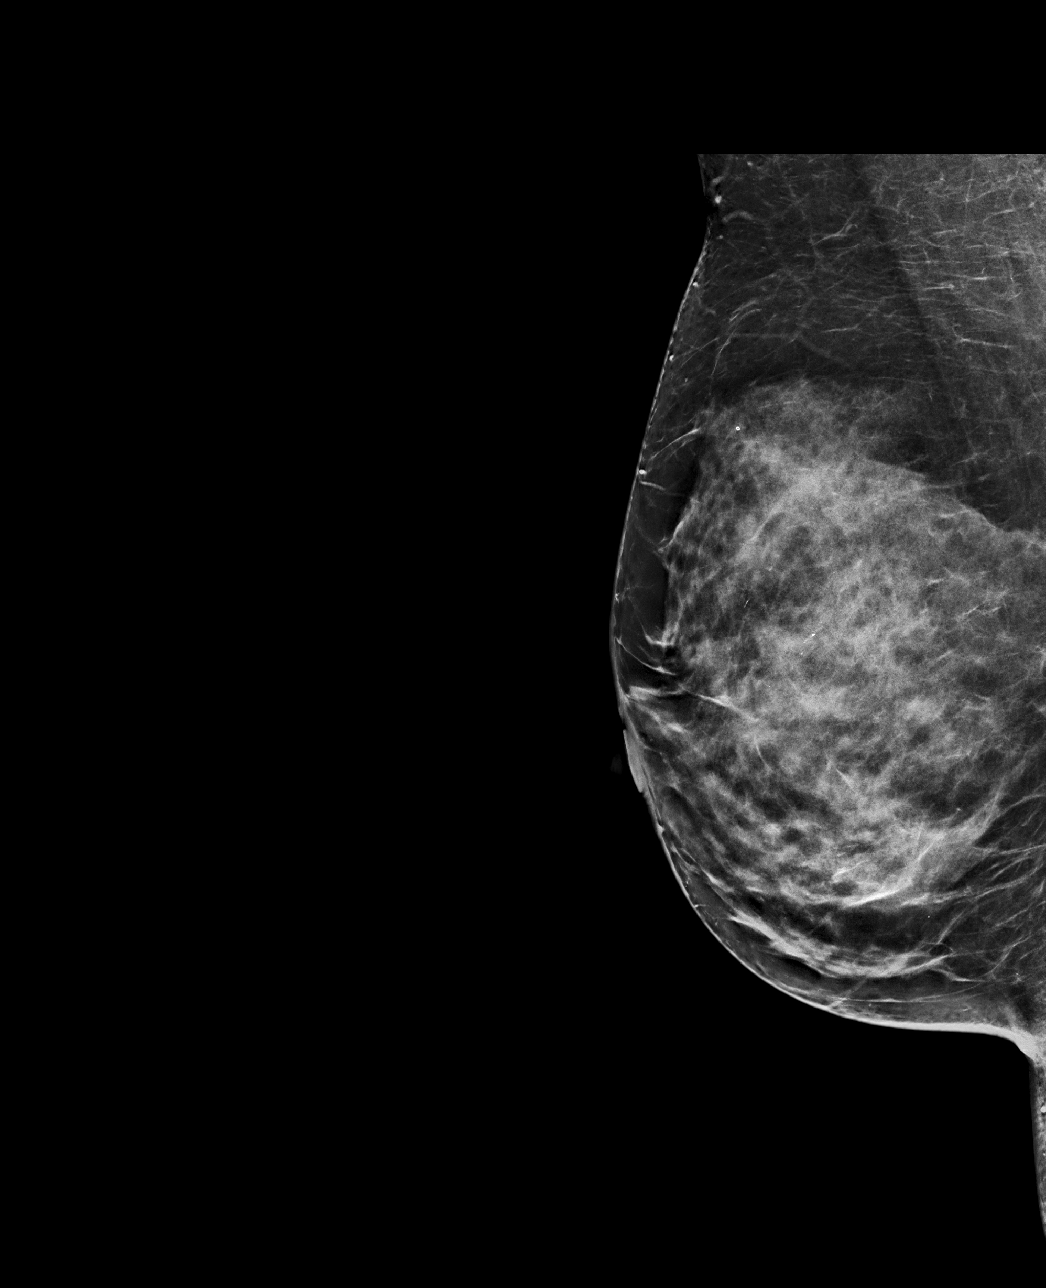

[R CC synth-2D]
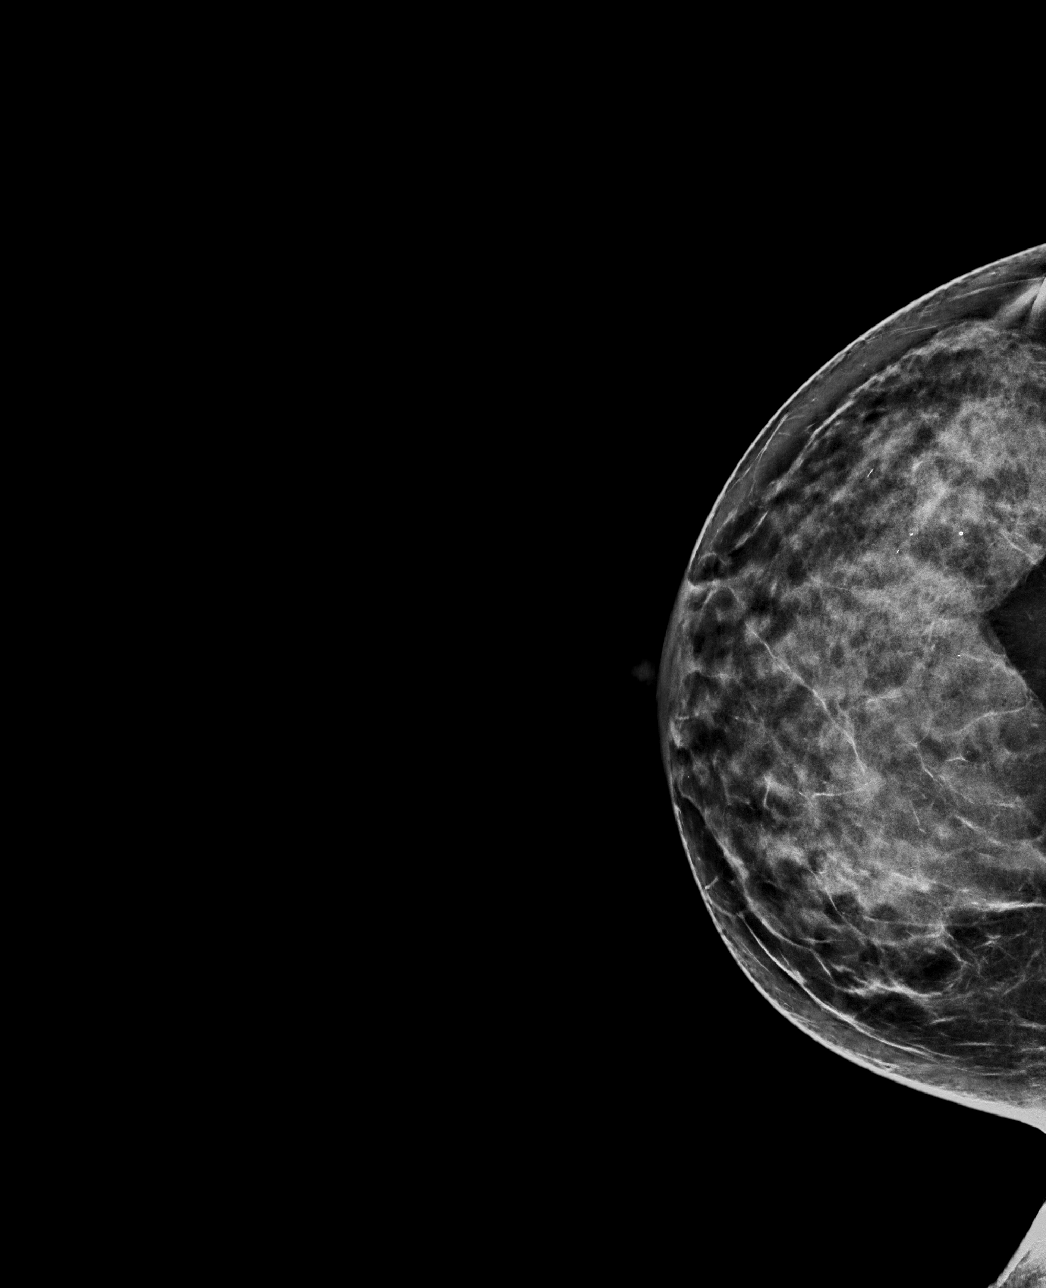

[R CC tomo · tomo slice 37/74.0]
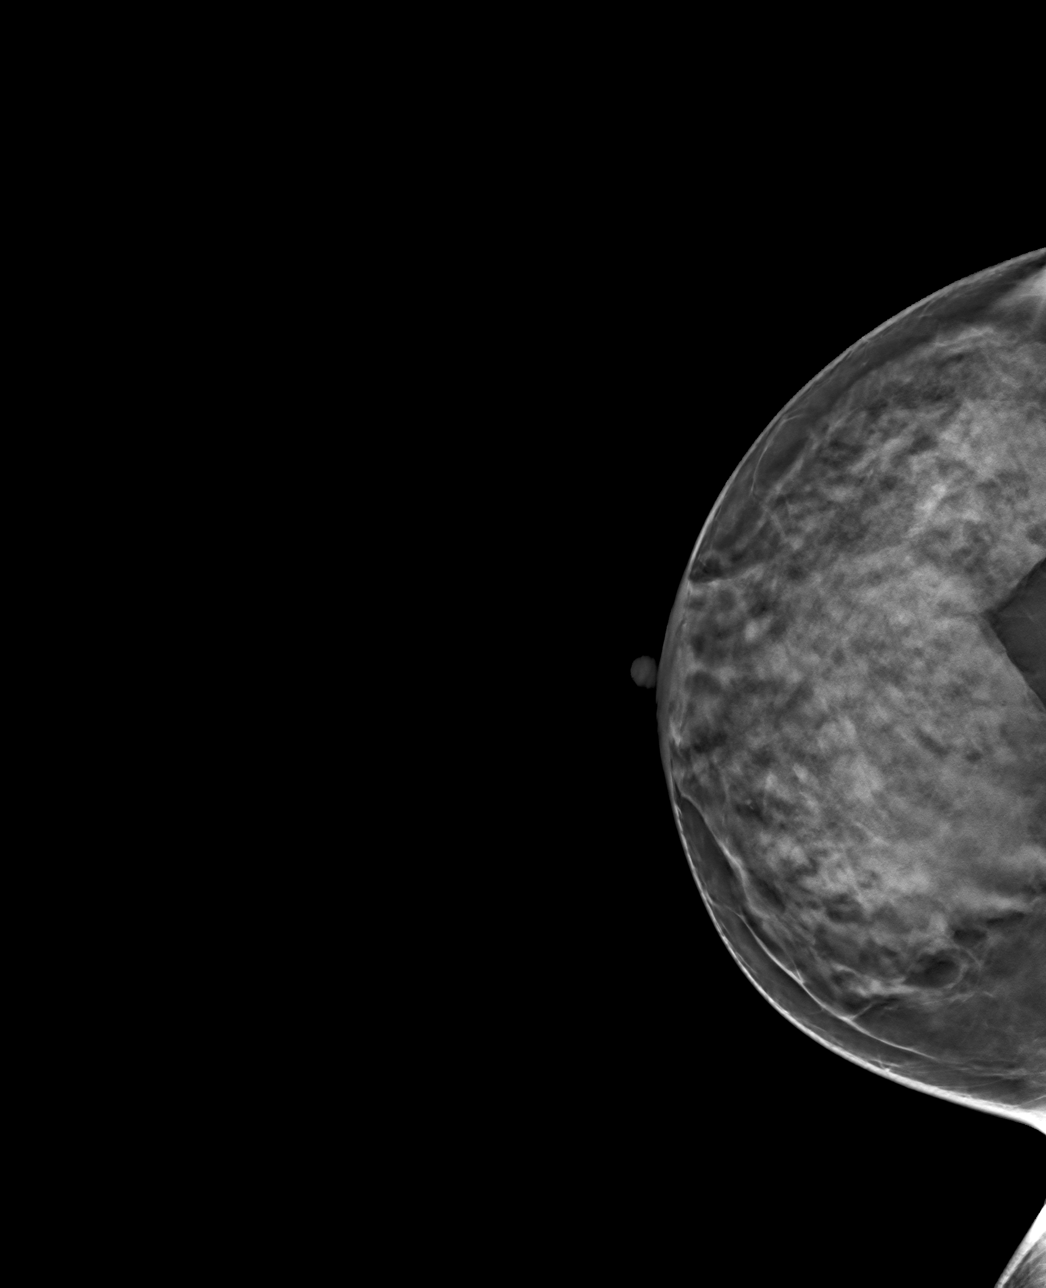

[R MLO tomo · tomo slice 39/76.0]
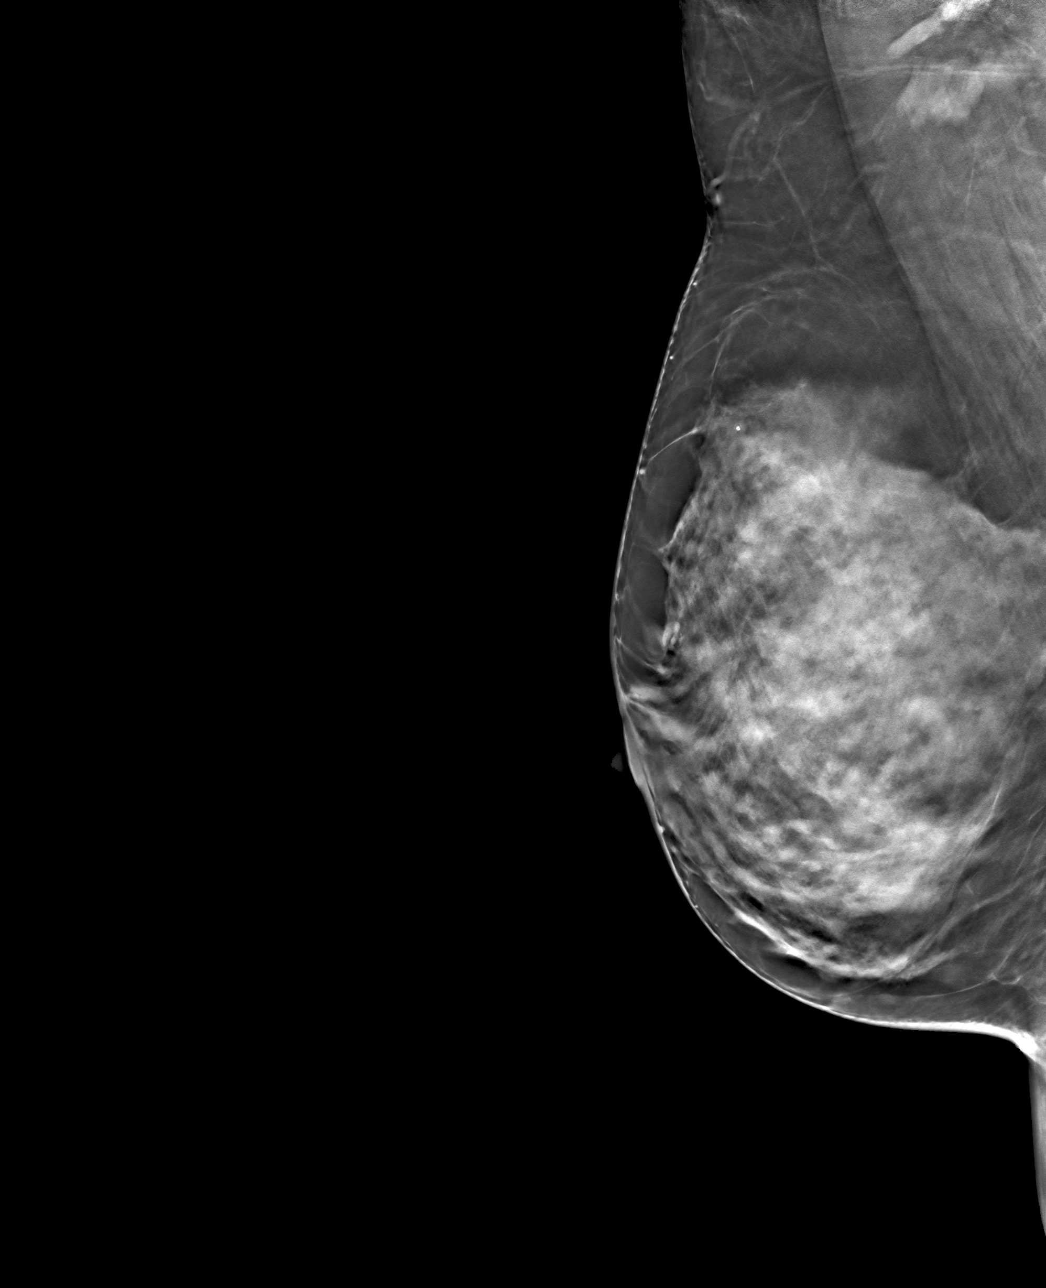

[4 of 12 positions shown; findings below may reference images not displayed]

ACR Breast Density Category d: The breast tissue is extremely dense,
which lowers the sensitivity of mammography.
FINDINGS: No suspicious mass, distortion, or microcalcifications are
identified to suggest presence of malignancy. The asymmetry
previously noted in the MEDIAL portion of the RIGHT breast is less
apparent today. No sonographic correlate was identified for the
mammographic asymmetry at the time of previous evaluation.
IMPRESSION: Stable or improved appearance of asymmetry in the MEDIAL RIGHT
breast. No mammographic evidence for malignancy.

RECOMMENDATION:
Bilateral diagnostic mammogram is recommended in 6 months.

I have discussed the findings and recommendations with the patient.
If applicable, a reminder letter will be sent to the patient
regarding the next appointment.

BI-RADS CATEGORY  3: Probably benign.

## 2023-04-28 ENCOUNTER — Other Ambulatory Visit: Payer: Self-pay

## 2023-04-28 DIAGNOSIS — Z1231 Encounter for screening mammogram for malignant neoplasm of breast: Secondary | ICD-10-CM

## 2023-05-04 ENCOUNTER — Ambulatory Visit: Payer: Self-pay | Attending: Hematology and Oncology | Admitting: Hematology and Oncology

## 2023-05-04 ENCOUNTER — Ambulatory Visit
Admission: RE | Admit: 2023-05-04 | Discharge: 2023-05-04 | Disposition: A | Discharge status: Home / Self Care | Payer: Self-pay | Source: Ambulatory Visit | Attending: Obstetrics and Gynecology | Admitting: Obstetrics and Gynecology

## 2023-05-04 VITALS — BP 132/92 | Wt 185.2 lb

## 2023-05-04 DIAGNOSIS — Z1231 Encounter for screening mammogram for malignant neoplasm of breast: Secondary | ICD-10-CM

## 2023-05-04 NOTE — Progress Notes (Signed)
Amanda Holder is a 57 y.o. female who presents to [facility] clinic today with no complaints.    Pap Smear: Pap not smear completed today. Last Pap smear was 03/28/2020 at [facility] and was normal. Per patient has no history of an abnormal Pap smear. Last Pap smear result is available in Epic.   Physical exam: Breasts Breasts symmetrical. No skin abnormalities bilateral breasts. No nipple retraction bilateral breasts. No nipple discharge bilateral breasts. No lymphadenopathy. No lumps palpated bilateral breasts.     MS DIGITAL DIAG TOMO BILAT  Result Date: 03/24/2022 CLINICAL DATA:  Follow-up for probably benign asymmetry within the RIGHT breast. This probably benign asymmetry, without sonographic correlate, was initially identified in October of 2021. EXAM: DIGITAL DIAGNOSTIC BILATERAL MAMMOGRAM WITH TOMOSYNTHESIS TECHNIQUE: Bilateral digital diagnostic mammography and breast tomosynthesis was performed. COMPARISON:  Previous exam(s). ACR Breast Density Category c: The breast tissue is heterogeneously dense, which may obscure small masses. FINDINGS: The previously questioned asymmetry within the inner RIGHT breast is stable, now shown stable for 2 years confirming benignity, compatible with an [place] of normal dense fibroglandular tissue. There are no new dominant masses, suspicious calcifications or secondary signs of malignancy elsewhere within either breast. IMPRESSION: No evidence of malignancy within either breast. Stable benign asymmetry within the inner RIGHT breast, now shown stable for 2 years confirming benignity, compatible with an [place] of normal dense fibroglandular tissue. Patient may return to routine annual bilateral screening mammogram schedule. RECOMMENDATION: Screening mammogram in one year.(Code:[id number]) I have discussed the findings and recommendations with the patient. If applicable, a reminder letter will be sent to the patient regarding the next appointment. BI-RADS  CATEGORY  1: Negative. Electronically Signed   By: Bary Richard M.D.   On: 03/24/2022 10:18   MS DIGITAL DIAG TOMO BILAT  Result Date: 03/20/2021 CLINICAL DATA:  BI-RADS 3 follow-up of a RIGHT breast asymmetry, initiated 10/21. No sonographic correlate. EXAM: DIGITAL DIAGNOSTIC BILATERAL MAMMOGRAM WITH TOMOSYNTHESIS AND CAD TECHNIQUE: Bilateral digital diagnostic mammography and breast tomosynthesis was performed. The images were evaluated with computer-aided detection. COMPARISON:  Previous exam(s). ACR Breast Density Category d: The breast tissue is extremely dense, which lowers the sensitivity of mammography. FINDINGS: Diagnostic images of the RIGHT breast demonstrate mammographic stability of a subtle RIGHT breast asymmetry in the inner breast at middle to posterior depth on CC view only, likely reflecting patient's baseline configuration of fibroglandular tissue. No new suspicious findings in the RIGHT breast. No suspicious mass, distortion, or microcalcifications are identified to suggest presence of malignancy in the LEFT breast. IMPRESSION: 1. Stable probably benign RIGHT breast asymmetry seen on CC view only. Recommend follow-up diagnostic mammogram in 1 year. This will establish 2 years of definitive stability. 2. No mammographic evidence of malignancy in LEFT breast. RECOMMENDATION: Bilateral diagnostic mammogram with RIGHT ultrasound if deemed necessary in 1 year. I have discussed the findings and recommendations with the patient. If applicable, a reminder letter will be sent to the patient regarding the next appointment. BI-RADS CATEGORY  3: Probably benign. Electronically Signed   By: Meda Klinefelter M.D.   On: 03/20/2021 11:00  MS DIGITAL DIAG TOMO UNI RIGHT  Result Date: 09/28/2020 CLINICAL DATA:  First six-month follow-up for probably benign asymmetry in the RIGHT breast. EXAM: DIGITAL DIAGNOSTIC UNILATERAL RIGHT MAMMOGRAM WITH TOMOSYNTHESIS AND CAD TECHNIQUE: Right digital diagnostic  mammography and breast tomosynthesis was performed. The images were evaluated with computer-aided detection. COMPARISON:  Previous exam(s). ACR Breast Density Category d: The breast tissue is extremely dense,  which lowers the sensitivity of mammography. FINDINGS: No suspicious mass, distortion, or microcalcifications are identified to suggest presence of malignancy. The asymmetry previously noted in the MEDIAL portion of the RIGHT breast is less apparent today. No sonographic correlate was identified for the mammographic asymmetry at the time of previous evaluation. IMPRESSION: Stable or improved appearance of asymmetry in the MEDIAL RIGHT breast. No mammographic evidence for malignancy. RECOMMENDATION: Bilateral diagnostic mammogram is recommended in 6 months. I have discussed the findings and recommendations with the patient. If applicable, a reminder letter will be sent to the patient regarding the next appointment. BI-RADS CATEGORY  3: Probably benign. Electronically Signed   By: Norva Pavlov M.D.   On: 09/28/2020 10:42   MS DIGITAL DIAG TOMO UNI RIGHT  Result Date: 03/28/2020 CLINICAL DATA:  57 year old female recalled from screening mammogram dated 03/14/2020 for a possible right breast asymmetry. EXAM: DIGITAL DIAGNOSTIC RIGHT MAMMOGRAM WITH CAD AND TOMO ULTRASOUND RIGHT BREAST COMPARISON:  Previous exam(s). ACR Breast Density Category d: The breast tissue is extremely dense, which lowers the sensitivity of mammography. FINDINGS: Previously described, possible asymmetry in the medial right breast at middle depth is seen on the cc projection only partially effaces on today's additional views. This area has the appearance of a focal area of dense fibroglandular tissue. Precautionary ultrasound was performed. Mammographic images were processed with CAD. Targeted ultrasound is performed, showing dense fibroglandular tissue without focal or suspicious sonographic abnormality. Extensive evaluation of the  entire medial right breast was performed. IMPRESSION: Probably benign right breast asymmetry without ultrasound correlate. This area partially effaces on today's additional views and has the appearance of focally dense fibroglandular tissue. Precautionary short-term follow-up recommended. RECOMMENDATION: Diagnostic right breast mammogram and possible ultrasound in 6 months. I have discussed the findings and recommendations with the patient. If applicable, a reminder letter will be sent to the patient regarding the next appointment. BI-RADS CATEGORY  3: Probably benign. Electronically Signed   By: Sande Brothers M.D.   On: 03/28/2020 11:33   MS DIGITAL SCREENING TOMO BILATERAL  Result Date: 03/16/2020 CLINICAL DATA:  Screening. EXAM: DIGITAL SCREENING BILATERAL MAMMOGRAM WITH TOMO AND CAD COMPARISON:  Previous exam(s). ACR Breast Density Category d: The breast tissue is extremely dense, which lowers the sensitivity of mammography. FINDINGS: In the right breast, a possible asymmetry warrants further evaluation. In the left breast, no findings suspicious for malignancy. Images were processed with CAD. IMPRESSION: Further evaluation is suggested for possible asymmetry in the right breast. RECOMMENDATION: Diagnostic mammogram and possibly ultrasound of the right breast. (Code:[id number]) The patient will be contacted regarding the findings, and additional imaging will be scheduled. BI-RADS CATEGORY  0: Incomplete. Need additional imaging evaluation and/or prior mammograms for comparison. Electronically Signed   By: Emmaline Kluver M.D.   On: 03/16/2020 14:45   MM 3D SCREEN BREAST BILATERAL  Result Date: 03/15/2019 CLINICAL DATA:  Screening. EXAM: DIGITAL SCREENING BILATERAL MAMMOGRAM WITH TOMO AND CAD COMPARISON:  Previous exam(s). ACR Breast Density Category d: The breast tissue is extremely dense, which lowers the sensitivity of mammography FINDINGS: There are no findings suspicious for malignancy. Images  were processed with CAD. IMPRESSION: No mammographic evidence of malignancy. A result letter of this screening mammogram will be mailed directly to the patient. RECOMMENDATION: Screening mammogram in one year. (Code:[id number]) BI-RADS CATEGORY  1: Negative. Electronically Signed   By: Frederico Hamman M.D.   On: 03/15/2019 14:43      Pelvic/Bimanual Pap is not indicated today    Smoking History:  Patient has never smoked and was not referred to quit line.    Patient Navigation: Patient education provided. Access to services provided for patient through [facility] program. No interpreter provided. No transportation provided   Colorectal Cancer Screening: Per patient has never had colonoscopy completed No complaints today. FIT test completed 06/17/2022   Breast and Cervical Cancer Risk Assessment: Patient does not have family history of breast cancer, known genetic mutations, or radiation treatment to the chest before age 40. Patient does not have history of cervical dysplasia, immunocompromised, or DES exposure in-utero.  Risk Scores as of Encounter on 05/04/2023     Dondra Spry           5-year 0.91%   Lifetime 4.99%            Last calculated by Narda Rutherford, LPN on 40/01/8118 at  1:37 PM        A: [facility] exam without pap smear No complaints with benign exam.  P:Referred patient to the [facility] for a screening mammogram. Appointment scheduled 05/04/2023   Pascal Lux, NP 05/04/2023 1:43 PM

## 2023-05-04 NOTE — Patient Instructions (Signed)
Taught Amanda Holder about self breast awareness and gave educational materials to take home. Patient did not need a Pap smear today due to last Pap smear was in 03/28/2020 per patient. Let her know [facility] will cover Pap smears every 5 years unless has a history of abnormal Pap smears. Referred patient to the [facility] for screening mammogram. Appointment scheduled for 05/04/2023. Patient aware of appointment and will be there. Let patient know will follow up with her within the next couple weeks with results. Amanda Holder verbalized understanding.  Pascal Lux, NP 1:46 PM

## 2023-05-06 ENCOUNTER — Other Ambulatory Visit: Payer: Self-pay | Admitting: Obstetrics and Gynecology

## 2023-05-06 DIAGNOSIS — R928 Other abnormal and inconclusive findings on diagnostic imaging of breast: Secondary | ICD-10-CM

## 2023-05-07 ENCOUNTER — Other Ambulatory Visit: Payer: Self-pay

## 2023-05-15 ENCOUNTER — Ambulatory Visit
Admission: RE | Admit: 2023-05-15 | Discharge: 2023-05-15 | Disposition: A | Discharge status: Home / Self Care | Payer: Self-pay | Source: Ambulatory Visit | Attending: Obstetrics and Gynecology | Admitting: Obstetrics and Gynecology

## 2023-05-15 DIAGNOSIS — R928 Other abnormal and inconclusive findings on diagnostic imaging of breast: Secondary | ICD-10-CM

## 2023-05-20 ENCOUNTER — Other Ambulatory Visit: Payer: Self-pay | Admitting: Medical Genetics

## 2023-05-21 ENCOUNTER — Other Ambulatory Visit
Admission: RE | Admit: 2023-05-21 | Discharge: 2023-05-21 | Disposition: A | Discharge status: Home / Self Care | Payer: Self-pay | Source: Ambulatory Visit | Attending: Medical Genetics | Admitting: Medical Genetics

## 2023-06-01 LAB — GENECONNECT MOLECULAR SCREEN: Genetic Analysis Overall Interpretation: NEGATIVE

## 2023-08-20 LAB — COLOGUARD: COLOGUARD: NEGATIVE

## 2024-02-11 ENCOUNTER — Telehealth: Payer: Self-pay | Admitting: *Deleted

## 2024-03-02 ENCOUNTER — Other Ambulatory Visit: Payer: Self-pay | Admitting: Specialist

## 2024-03-02 DIAGNOSIS — Z1231 Encounter for screening mammogram for malignant neoplasm of breast: Secondary | ICD-10-CM

## 2024-05-04 ENCOUNTER — Ambulatory Visit
Admission: RE | Admit: 2024-05-04 | Discharge: 2024-05-04 | Disposition: A | Discharge status: Home / Self Care | Payer: Self-pay | Source: Ambulatory Visit | Attending: Specialist | Admitting: Specialist

## 2024-05-04 DIAGNOSIS — Z1231 Encounter for screening mammogram for malignant neoplasm of breast: Secondary | ICD-10-CM | POA: Insufficient documentation

## 2024-05-24 ENCOUNTER — Other Ambulatory Visit: Payer: Self-pay

## 2024-05-24 DIAGNOSIS — R92343 Mammographic extreme density, bilateral breasts: Secondary | ICD-10-CM

## 2024-06-10 ENCOUNTER — Other Ambulatory Visit: Payer: Self-pay

## 2024-06-10 DIAGNOSIS — R92343 Mammographic extreme density, bilateral breasts: Secondary | ICD-10-CM

## 2024-06-20 ENCOUNTER — Ambulatory Visit

## 2024-06-28 ENCOUNTER — Ambulatory Visit: Payer: Self-pay

## 2024-07-18 ENCOUNTER — Ambulatory Visit: Payer: Self-pay

## 2024-07-18 ENCOUNTER — Other Ambulatory Visit: Payer: Self-pay
